# Patient Record
Sex: Female | Born: 1960
Health system: Southern US, Community
[De-identification: ages and names within clinical notes are randomized; demographics above are authoritative.]

## PROBLEM LIST (undated history)

## (undated) DIAGNOSIS — F329 Major depressive disorder, single episode, unspecified: Secondary | ICD-10-CM

## (undated) DIAGNOSIS — M1990 Unspecified osteoarthritis, unspecified site: Secondary | ICD-10-CM

## (undated) DIAGNOSIS — F32A Depression, unspecified: Secondary | ICD-10-CM

## (undated) DIAGNOSIS — R7303 Prediabetes: Secondary | ICD-10-CM

## (undated) DIAGNOSIS — M51369 Other intervertebral disc degeneration, lumbar region without mention of lumbar back pain or lower extremity pain: Secondary | ICD-10-CM

## (undated) DIAGNOSIS — M5136 Other intervertebral disc degeneration, lumbar region: Secondary | ICD-10-CM

## (undated) DIAGNOSIS — I10 Essential (primary) hypertension: Secondary | ICD-10-CM

## (undated) HISTORY — DX: Essential (primary) hypertension: I10

## (undated) HISTORY — DX: Other intervertebral disc degeneration, lumbar region: M51.36

## (undated) HISTORY — DX: Major depressive disorder, single episode, unspecified: F32.9

## (undated) HISTORY — DX: Unspecified osteoarthritis, unspecified site: M19.90

## (undated) HISTORY — DX: Depression, unspecified: F32.A

## (undated) HISTORY — PX: DILATION AND CURETTAGE, DIAGNOSTIC / THERAPEUTIC: SUR384

## (undated) HISTORY — DX: Other intervertebral disc degeneration, lumbar region without mention of lumbar back pain or lower extremity pain: M51.369

---

## 1995-02-28 HISTORY — PX: TUBAL LIGATION: SHX77

## 1998-11-08 ENCOUNTER — Other Ambulatory Visit: Admission: RE | Admit: 1998-11-08 | Discharge: 1998-11-08 | Payer: Self-pay | Admitting: Obstetrics and Gynecology

## 1998-12-24 ENCOUNTER — Ambulatory Visit (HOSPITAL_COMMUNITY): Admission: RE | Admit: 1998-12-24 | Discharge: 1998-12-24 | Payer: Self-pay | Admitting: Obstetrics and Gynecology

## 1998-12-25 ENCOUNTER — Emergency Department (HOSPITAL_COMMUNITY): Admission: EM | Admit: 1998-12-25 | Discharge: 1998-12-25 | Payer: Self-pay | Admitting: Emergency Medicine

## 2000-09-19 ENCOUNTER — Emergency Department (HOSPITAL_COMMUNITY): Admission: EM | Admit: 2000-09-19 | Discharge: 2000-09-19 | Payer: Self-pay | Admitting: Emergency Medicine

## 2001-05-27 ENCOUNTER — Other Ambulatory Visit: Admission: RE | Admit: 2001-05-27 | Discharge: 2001-05-27 | Payer: Self-pay | Admitting: Obstetrics and Gynecology

## 2002-03-15 ENCOUNTER — Inpatient Hospital Stay (HOSPITAL_COMMUNITY): Admission: EM | Admit: 2002-03-15 | Discharge: 2002-03-17 | Payer: Self-pay | Admitting: Emergency Medicine

## 2003-01-26 ENCOUNTER — Other Ambulatory Visit: Admission: RE | Admit: 2003-01-26 | Discharge: 2003-01-26 | Payer: Self-pay | Admitting: Obstetrics and Gynecology

## 2004-06-23 ENCOUNTER — Other Ambulatory Visit: Admission: RE | Admit: 2004-06-23 | Discharge: 2004-06-23 | Payer: Self-pay | Admitting: Obstetrics and Gynecology

## 2005-03-23 ENCOUNTER — Emergency Department (HOSPITAL_COMMUNITY): Admission: EM | Admit: 2005-03-23 | Discharge: 2005-03-23 | Payer: Self-pay | Admitting: Emergency Medicine

## 2011-02-12 ENCOUNTER — Ambulatory Visit: Payer: Self-pay

## 2011-02-12 DIAGNOSIS — N760 Acute vaginitis: Secondary | ICD-10-CM

## 2011-04-24 ENCOUNTER — Other Ambulatory Visit: Payer: Self-pay

## 2011-04-25 ENCOUNTER — Telehealth: Payer: Self-pay

## 2011-04-25 NOTE — Telephone Encounter (Signed)
Pt calling for third time today. States she and pharmacy called last week for refill and pharmacy told her they were waiting to hear from [facility] on status of refill.  [facility] [address]   Pt best: [phone]  bf

## 2011-04-25 NOTE — Telephone Encounter (Signed)
Calling for med refills  Claims it did not work because she lost four pills down the sink and had a lapse in treatment. She has no insurance and would like a second dose of the original prescription.

## 2011-04-26 NOTE — Telephone Encounter (Signed)
LMOM to let pt know that we had faxed pharmacy back on the 25th and 26th that pt would need OV bf we could Rx medication. Left our hours this week

## 2011-05-31 ENCOUNTER — Ambulatory Visit: Payer: Self-pay | Admitting: Physician Assistant

## 2011-05-31 VITALS — BP 144/73 | HR 89 | Temp 98.0°F | Resp 16 | Ht 66.0 in | Wt 187.0 lb

## 2011-05-31 DIAGNOSIS — N898 Other specified noninflammatory disorders of vagina: Secondary | ICD-10-CM

## 2011-05-31 DIAGNOSIS — B009 Herpesviral infection, unspecified: Secondary | ICD-10-CM

## 2011-05-31 DIAGNOSIS — N952 Postmenopausal atrophic vaginitis: Secondary | ICD-10-CM

## 2011-05-31 LAB — POCT WET PREP WITH KOH
Clue Cells Wet Prep HPF POC: NEGATIVE
Trichomonas, UA: NEGATIVE
Yeast Wet Prep HPF POC: NEGATIVE

## 2011-05-31 LAB — POCT URINALYSIS DIPSTICK
Glucose, UA: NEGATIVE
Spec Grav, UA: 1.025
Urobilinogen, UA: 0.2

## 2011-05-31 MED ORDER — VALACYCLOVIR HCL 1 G PO TABS: 500.0000 mg | ORAL_TABLET | Freq: Two times a day (BID) | ORAL | Status: DC

## 2011-05-31 MED ORDER — ESTRADIOL 0.1 MG/GM VA CREA: 2.0000 g | TOPICAL_CREAM | VAGINAL | Status: DC

## 2011-05-31 NOTE — Patient Instructions (Signed)
Atrophic Vaginitis  Atrophic vaginitis is a problem of low levels of estrogen in women. This problem can happen at any age. It is most common in women who have gone through menopause ("the change").    HOW WILL I KNOW IF I HAVE THIS PROBLEM?  You may have:   Trouble with peeing (urinating), such as:   Going to the bathroom often.   A hard time holding your pee until you reach a bathroom.   Leaking pee.   Having pain when you pee.   Itching or a burning feeling.   Vaginal bleeding and spotting.   Pain during sex.   Dryness of the vagina.   A yellow, bad-smelling fluid (discharge) coming from the vagina.  HOW WILL MY DOCTOR CHECK FOR THIS PROBLEM?   During your exam, your doctor will likely find the problem.   If there is a vaginal fluid, it may be checked for infection.  HOW WILL THIS PROBLEM BE TREATED?  Keep the vulvar skin as clean as possible. Moisturizers and lubricants can help with some of the symptoms.  Estrogen replacement can help. There are 2 ways to take estrogen:   Systemic estrogen gets estrogen to your whole body. It takes many weeks or months before the symptoms get better.   You take an estrogen pill.   You use a skin patch. This is a patch that you put on your skin.   If you still have your uterus, your doctor may ask you to take a hormone. Talk to your doctor about the right medicine for you.   Estrogen cream.  This puts estrogen only at the part of your body where you apply it. The cream is put into the vagina or put on the vulvar skin. For some women, estrogen cream works faster than pills or the patch.  CAN ALL WOMEN WITH THIS PROBLEM USE ESTROGEN?  No. Women with certain types of cancer, liver problems, or problems with blood clots should not take estrogen. Your doctor can help you decide the best treatment for your symptoms.  Document Released: 08/02/2007 Document Revised: 02/02/2011 Document Reviewed: 08/02/2007  Exit[employer] Patient Information 2012 [employer].

## 2011-05-31 NOTE — Progress Notes (Signed)
  Subjective:    [name], female    DOB: 1960-11-02, 51 y.o.   MRN: [id number]  HPI  LMP two months ago Irritation and heat in vaginal area Clear discharge Perimenopausal Hot flashes/night sweats for the last year Increased urination "heat" when urinating Menopause One vitamins for symptoms  Concerned about DM, tingling in toes, legs hurting her. Dr. Henderson Cloud is GYN No PCP since husband passed.  Uninsured currently. Mother with DM, s/p bilateral TKA  Review of Systems     Objective:   Physical Exam        Assessment & Plan:

## 2011-05-31 NOTE — Progress Notes (Signed)
  Subjective:    [name], female    DOB: 1960-06-30, 51 y.o.   MRN: [id number]  HPI    Review of Systems     Objective:   Physical Exam        Assessment & Plan:

## 2011-06-05 ENCOUNTER — Telehealth: Payer: Self-pay

## 2011-06-05 NOTE — Telephone Encounter (Signed)
When I reviewed her lab results she does not have bacteria that causes infection just normal vaginal bacteria - I think that her problem is mainly vaginitis 2nd to dryness.  It will take several weeks for the estrogen to start to relieve her symptoms.

## 2011-06-05 NOTE — Telephone Encounter (Signed)
PT STATES SHE HAD A PAP DONE AND THEY SAW BACTERIA IN HER VAGINA. SHE STILL HAVE SOME DISCOMFORT AND WOULD LIKE TO KNOW IF WE WOULD CALL HER SOMETHING IN PLEASE CALL PT AT [phone]   [facility] [address]

## 2011-06-05 NOTE — Telephone Encounter (Signed)
Spoke with pt advised message from Kathryn Parks. Pt understood

## 2011-09-21 ENCOUNTER — Ambulatory Visit: Payer: Self-pay | Admitting: Emergency Medicine

## 2011-09-21 VITALS — BP 131/84 | HR 72 | Temp 98.3°F | Resp 18 | Wt 190.0 lb

## 2011-09-21 DIAGNOSIS — N952 Postmenopausal atrophic vaginitis: Secondary | ICD-10-CM

## 2011-09-21 DIAGNOSIS — N9489 Other specified conditions associated with female genital organs and menstrual cycle: Secondary | ICD-10-CM

## 2011-09-21 DIAGNOSIS — N898 Other specified noninflammatory disorders of vagina: Secondary | ICD-10-CM

## 2011-09-21 DIAGNOSIS — R300 Dysuria: Secondary | ICD-10-CM

## 2011-09-21 LAB — POCT URINALYSIS DIPSTICK
Blood, UA: NEGATIVE
Leukocytes, UA: NEGATIVE
Nitrite, UA: NEGATIVE
Protein, UA: NEGATIVE
Urobilinogen, UA: 0.2
pH, UA: 6

## 2011-09-21 LAB — POCT UA - MICROSCOPIC ONLY
Casts, Ur, LPF, POC: NEGATIVE
Crystals, Ur, HPF, POC: NEGATIVE
Mucus, UA: NEGATIVE
Yeast, UA: NEGATIVE

## 2011-09-21 MED ORDER — FLUCONAZOLE 150 MG PO TABS: 150.0000 mg | ORAL_TABLET | Freq: Once | ORAL | Status: AC

## 2011-09-21 MED ORDER — METRONIDAZOLE 500 MG PO TABS: 500.0000 mg | ORAL_TABLET | Freq: Two times a day (BID) | ORAL | Status: AC

## 2011-09-21 MED ORDER — ESTRADIOL 0.1 MG/GM VA CREA: 2.0000 g | TOPICAL_CREAM | VAGINAL | Status: DC

## 2011-09-21 NOTE — Progress Notes (Signed)
[facility] [address] [place] [address] [phone]  Date:  09/21/2011   Name:  Kathryn Parks   DOB:  [birth year]   MRN:  [id number]  PCP:  No primary provider on file.    Chief Complaint: Back Pain and vaginal dryness   History of Present Illness:  Kathryn Parks is a 51 y.o. very pleasant female patient who presents with the following:  She continues to have trouble with vaginal discomfort.  She is worried that she may have a UTI.  She does have HSV- she has had this since about 45.   She is going through menopause.  Her last period was 08/29/11.  Her bleeding has been irregular.  Kathryn Parks has been going through menopause for a couple of years, but her symptoms have been present for about 9 months.   She notes vaginal dryness and itching, and urine stings the skin.  Not really having discharge.   She has been taking some azo for possible UTI.  She has noted these symptoms on and off for several months.  She has had STI testing and this was negative.    She never actually filled the estrogen cream that she was given at her last visit due to expense.    Kathryn Parks also notes some lower back pain.  It can occur on and off, and gets better with a BC powder.  She has had this problem for about 6 months.    S/p BTL  There is no problem list on file for this patient.   No past medical history on file.  No past surgical history on file.  History  Substance Use Topics  . Smoking status: Never Smoker   . Smokeless tobacco: Not on file  . Alcohol Use: Not on file    No family history on file.  No Known Allergies  Medication list has been reviewed and updated.  Current Outpatient Prescriptions on File Prior to Visit  Medication Sig Dispense Refill  . estradiol (ESTRACE VAGINAL) 0.1 MG/GM vaginal cream Place 0.25 Applicatorfuls vaginally 2 (two) times a week.  42.5 g  12    Review of Systems:  As per HPI- otherwise negative.   Physical  Examination: Filed Vitals:   09/21/11 1651  BP: 131/84  Pulse: 72  Temp: 98.3 F (36.8 C)  Resp: 18   Filed Vitals:   09/21/11 1651  Weight: 190 lb (86.183 kg)   There is no height on file to calculate BMI. Ideal Body Weight:    GEN: WDWN, NAD, Non-toxic, A & O x 3 HEENT: Atraumatic, Normocephalic. Neck supple. No masses, No LAD. Ears and Nose: No external deformity. CV: RRR, No M/G/R. No JVD. No thrill. No extra heart sounds. PULM: CTA B, no wheezes, crackles, rhonchi. No retractions. No resp. distress. No accessory muscle use. ABD: S, NT, ND, +BS. No rebound. No HSM. EXTR: No c/c/e NEURO Normal gait.  PSYCH: Normally interactive. Conversant. Not depressed or anxious appearing.  Calm demeanor.  GU: external genitals are dry and atrophic  Results for orders placed in visit on 09/21/11  POCT UA - MICROSCOPIC ONLY      Component Value Range   WBC, Ur, HPF, POC 1-2     RBC, urine, microscopic 1-3     Bacteria, U Microscopic tr     Mucus, UA neg     Epithelial cells, urine per micros 1-3     Crystals, Ur, HPF, POC neg  Casts, Ur, LPF, POC neg     Yeast, UA neg    POCT URINALYSIS DIPSTICK      Component Value Range   Color, UA yellow     Clarity, UA clear     Glucose, UA neg     Bilirubin, UA neg     Ketones, UA neg     Spec Grav, UA 1.020     Blood, UA neg     pH, UA 6.0     Protein, UA neg     Urobilinogen, UA 0.2     Nitrite, UA neg     Leukocytes, UA Negative     Wet prep: negative  Assessment and Plan: 1. Dysuria  POCT UA - Microscopic Only, POCT urinalysis dipstick  2. Vaginal dryness  metroNIDAZOLE (FLAGYL) 500 MG tablet, fluconazole (DIFLUCAN) 150 MG tablet  3. Atrophic vaginitis  estradiol (ESTRACE VAGINAL) 0.1 MG/GM vaginal cream   Suspect that Kathryn Parks's symptoms are due to vaginal dryness. She did not understand that the estrace cream was meant to treat this condition- she will try and fit it into her budget and will look for a coupon.  Will try  estrace vaginal cream twice a week, and also treat for any infection with flagyl and diflucan/  She will let me know if not better in the next few weeks.    Kathryn Parks Chuong, MD   Although this note is appearing under Dr. Marice Potter (for unknown reasons) he did not see this patient- she was seen by myself, Dr. Shanda Bumps Kathryn Parks

## 2011-09-25 ENCOUNTER — Telehealth: Payer: Self-pay

## 2011-09-25 NOTE — Telephone Encounter (Signed)
PT STATES DR COPLAND TOLD HER TO CALL AND SEE IF WE HAVE SAMPLES OF ESTROGEN. PLEASE CALL [phone]

## 2011-09-26 NOTE — Telephone Encounter (Signed)
Advised we do not have any samples

## 2012-01-16 ENCOUNTER — Encounter (HOSPITAL_COMMUNITY): Payer: Self-pay | Admitting: Emergency Medicine

## 2012-01-16 ENCOUNTER — Emergency Department (HOSPITAL_COMMUNITY)
Admission: EM | Admit: 2012-01-16 | Discharge: 2012-01-16 | Disposition: A | Discharge status: Home / Self Care | Payer: Self-pay | Attending: Emergency Medicine | Admitting: Emergency Medicine

## 2012-01-16 DIAGNOSIS — X58XXXA Exposure to other specified factors, initial encounter: Secondary | ICD-10-CM | POA: Insufficient documentation

## 2012-01-16 DIAGNOSIS — Y939 Activity, unspecified: Secondary | ICD-10-CM | POA: Insufficient documentation

## 2012-01-16 DIAGNOSIS — S39012A Strain of muscle, fascia and tendon of lower back, initial encounter: Secondary | ICD-10-CM

## 2012-01-16 DIAGNOSIS — S335XXA Sprain of ligaments of lumbar spine, initial encounter: Secondary | ICD-10-CM | POA: Insufficient documentation

## 2012-01-16 DIAGNOSIS — Y929 Unspecified place or not applicable: Secondary | ICD-10-CM | POA: Insufficient documentation

## 2012-01-16 LAB — WET PREP, GENITAL
Clue Cells Wet Prep HPF POC: NONE SEEN
Trich, Wet Prep: NONE SEEN
Yeast Wet Prep HPF POC: NONE SEEN

## 2012-01-16 LAB — URINALYSIS, ROUTINE W REFLEX MICROSCOPIC
Bilirubin Urine: NEGATIVE
Glucose, UA: NEGATIVE mg/dL
Ketones, ur: NEGATIVE mg/dL
pH: 6.5 (ref 5.0–8.0)

## 2012-01-16 LAB — URINE MICROSCOPIC-ADD ON

## 2012-01-16 MED ORDER — OXYCODONE-ACETAMINOPHEN 5-325 MG PO TABS
1.0000 | ORAL_TABLET | Freq: Once | ORAL | Status: AC
  Administered 2012-01-16: 1 via ORAL
  Filled 2012-01-16: qty 1

## 2012-01-16 MED ORDER — CYCLOBENZAPRINE HCL 5 MG PO TABS: 5.0000 mg | ORAL_TABLET | Freq: Three times a day (TID) | ORAL | Status: DC | PRN

## 2012-01-16 MED ORDER — NAPROXEN 500 MG PO TABS: 500.0000 mg | ORAL_TABLET | Freq: Two times a day (BID) | ORAL | Status: DC

## 2012-01-16 NOTE — ED Notes (Signed)
Patient given discharge instructions, information, prescriptions, and diet order. Patient states that they adequately understand discharge information given and to return to ED if symptoms return or worsen.     

## 2012-01-16 NOTE — ED Notes (Signed)
Pt states that she has had intermittent low back pain and vaginal dryness for 6 months.

## 2012-01-16 NOTE — ED Provider Notes (Signed)
History     CSN: [id number]  Arrival date & time 01/16/12  1729   First MD Initiated Contact with Patient 01/16/12 1926      Chief Complaint  Patient presents with  . Back Pain  . Vaginal Dryness    HPI Pt started having low back pain starting yesterday.  The pain is in the lower back.  It does not radiate.  It increases with movement.  Some burning with urination.  Pt has had some trouble with vaginal itching and dryness for 6 months.  Associated with this she has had some lower abdominal discomfort.  Pt thinks it could be menopause.  She has not seen a doctor for this before tonight.  When she developed the back pain today she became concerned.  She has noticed some yellow discharge as well. No past medical history on file.  No past surgical history on file.  No family history on file.  History  Substance Use Topics  . Smoking status: Never Smoker   . Smokeless tobacco: Not on file  . Alcohol Use: 0.6 oz/week    1 Glasses of wine per week    OB History    Grav Para Term Preterm Abortions TAB SAB Ect Mult Living                  Review of Systems  Constitutional: Negative for fever.  HENT: Negative for neck pain and voice change.   Respiratory: Negative for shortness of breath.   Cardiovascular: Negative for palpitations.  Gastrointestinal: Negative for abdominal pain.  Musculoskeletal: Negative for joint swelling.  Skin: Negative for color change.  Neurological: Negative for numbness (no paresthesias).       No muscle weakness  Psychiatric/Behavioral: Negative for confusion.  All other systems reviewed and are negative.    Allergies  Review of patient's allergies indicates no known allergies.  Home Medications  No current outpatient prescriptions on file.  BP 160/115  Pulse 79  Temp 98.1 F (36.7 C) (Oral)  Resp 12  SpO2 100%  LMP 12/26/2011  Physical Exam  Nursing note and vitals reviewed. Constitutional: She appears well-developed and  well-nourished. No distress.  HENT:  Head: Normocephalic and atraumatic.  Right Ear: External ear normal.  Left Ear: External ear normal.  Eyes: Conjunctivae normal are normal. Right eye exhibits no discharge. Left eye exhibits no discharge. No scleral icterus.  Neck: Neck supple. No tracheal deviation present.  Cardiovascular: Normal rate, regular rhythm and intact distal pulses.   Pulmonary/Chest: Effort normal and breath sounds normal. No stridor. No respiratory distress. She has no wheezes. She has no rales.  Abdominal: Soft. Bowel sounds are normal. She exhibits no distension. There is no tenderness. There is no rebound and no guarding. Hernia confirmed negative in the right inguinal area and confirmed negative in the left inguinal area.  Genitourinary: Vagina normal. Guaiac negative stool. There is no rash on the right labia. There is no rash on the left labia. Uterus is enlarged. Uterus is not deviated and not tender. Cervix exhibits no motion tenderness, no discharge and no friability. Right adnexum displays no mass. Left adnexum displays no mass. No vaginal discharge found.       Possible fibroids   Musculoskeletal: She exhibits tenderness. She exhibits no edema.       Lumbar back: She exhibits tenderness and pain. She exhibits no swelling, no edema and no deformity.  Neurological: She is alert. She has normal strength. No sensory deficit. Cranial nerve deficit:  no gross defecits noted. She exhibits normal muscle tone. She displays no seizure activity. Coordination normal.  Skin: Skin is warm and dry. No rash noted.  Psychiatric: She has a normal mood and affect.    ED Course  Procedures (including critical care time)  Labs Reviewed  URINALYSIS, ROUTINE W REFLEX MICROSCOPIC - Abnormal; Notable for the following:    Hgb urine dipstick TRACE (*)     All other components within normal limits  WET PREP, GENITAL - Abnormal; Notable for the following:    WBC, Wet Prep HPF POC FEW (*)      All other components within normal limits  URINE MICROSCOPIC-ADD ON  GC/CHLAMYDIA PROBE AMP  RPR   No results found.   1. Lumbar strain       MDM  Back pain is likely musculo skeletal.  Doubt urinary or pelvic etiology.  Recc follow up with a gynecologist to address the vaginal dryness possible menopausal symptoms.        Celene Kras, MD 01/16/12 [phone]

## 2012-01-16 NOTE — ED Notes (Addendum)
Pt from home, alert and oriented, with c/o lower back pain, vaginal dryness, itching and discharge at times- yellow tint. Sts she has had this problem x 1 year and it has gotten worse recently, also that the vaginal discharge that she experiences sometimes has changed colors to yellow. Pt sts she has been using aloe vera gel to lubricate her vagina.

## 2012-01-17 LAB — RPR: RPR Ser Ql: NONREACTIVE

## 2012-04-17 ENCOUNTER — Telehealth: Payer: Self-pay

## 2012-04-17 MED ORDER — ESTRADIOL 0.1 MG/GM VA CREA: 2.0000 g | TOPICAL_CREAM | Freq: Every day | VAGINAL | Status: DC

## 2012-04-17 MED ORDER — VALACYCLOVIR HCL 500 MG PO TABS: 500.0000 mg | ORAL_TABLET | Freq: Two times a day (BID) | ORAL | Status: DC

## 2012-04-17 NOTE — Telephone Encounter (Signed)
Pt would like to know if someone could call her a rx in for estrogen and valtrex. Pt states that she was seen in our office in sept or oct and was precribed those 2 rx's but at that time she did not have insurance and was unable to purchase them because of the price. Pt now has insurance and would greatly appreciate if the estrogen and valtrex could be called in again. Please advise.  Pharmacy:[facility] on [address]  [phone]

## 2012-04-17 NOTE — Addendum Note (Signed)
Addended by: Godfrey Pick on: 04/17/2012 03:55 PM   Modules accepted: Orders

## 2012-04-17 NOTE — Telephone Encounter (Signed)
Called patient to advise  °

## 2012-04-17 NOTE — Telephone Encounter (Signed)
Sent to pharmacy.  Pt needs OV before this runs out as she has not been seen since July 2013

## 2012-04-17 NOTE — Telephone Encounter (Signed)
She was here in July, and Estrace cream was sent. Can we resend this? The last Valtrex (herpes type 2) was from April 2013, was written for 500 mg to take 1/2 bid, please advise if we can resend this one also. Gen Clagg

## 2012-05-08 ENCOUNTER — Other Ambulatory Visit: Payer: Self-pay | Admitting: Obstetrics

## 2012-05-08 ENCOUNTER — Encounter: Payer: Self-pay | Admitting: Internal Medicine

## 2012-05-08 DIAGNOSIS — Z1231 Encounter for screening mammogram for malignant neoplasm of breast: Secondary | ICD-10-CM

## 2012-05-08 DIAGNOSIS — E28319 Asymptomatic premature menopause: Secondary | ICD-10-CM

## 2012-05-14 ENCOUNTER — Ambulatory Visit (HOSPITAL_COMMUNITY): Payer: BC Managed Care – PPO

## 2012-05-14 ENCOUNTER — Ambulatory Visit (HOSPITAL_COMMUNITY): Admission: RE | Admit: 2012-05-14 | Payer: BC Managed Care – PPO | Source: Ambulatory Visit

## 2012-05-20 ENCOUNTER — Encounter: Payer: Self-pay | Admitting: Internal Medicine

## 2012-05-27 ENCOUNTER — Ambulatory Visit (HOSPITAL_COMMUNITY)
Admission: RE | Admit: 2012-05-27 | Discharge: 2012-05-27 | Disposition: A | Discharge status: Home / Self Care | Payer: BC Managed Care – PPO | Source: Ambulatory Visit | Attending: Obstetrics | Admitting: Obstetrics

## 2012-05-27 DIAGNOSIS — Z1231 Encounter for screening mammogram for malignant neoplasm of breast: Secondary | ICD-10-CM | POA: Insufficient documentation

## 2012-05-27 LAB — HM MAMMOGRAPHY

## 2012-05-30 ENCOUNTER — Ambulatory Visit (INDEPENDENT_AMBULATORY_CARE_PROVIDER_SITE_OTHER): Payer: BC Managed Care – PPO | Admitting: Internal Medicine

## 2012-05-30 ENCOUNTER — Encounter: Payer: Self-pay | Admitting: Internal Medicine

## 2012-05-30 ENCOUNTER — Ambulatory Visit (INDEPENDENT_AMBULATORY_CARE_PROVIDER_SITE_OTHER)
Admission: RE | Admit: 2012-05-30 | Discharge: 2012-05-30 | Disposition: A | Discharge status: Home / Self Care | Payer: BC Managed Care – PPO | Source: Ambulatory Visit | Attending: Internal Medicine | Admitting: Internal Medicine

## 2012-05-30 VITALS — BP 130/80 | HR 71 | Temp 98.0°F | Resp 16 | Ht 65.0 in | Wt 193.0 lb

## 2012-05-30 DIAGNOSIS — M545 Low back pain, unspecified: Secondary | ICD-10-CM

## 2012-05-30 DIAGNOSIS — M25521 Pain in right elbow: Secondary | ICD-10-CM

## 2012-05-30 DIAGNOSIS — M79605 Pain in left leg: Secondary | ICD-10-CM | POA: Insufficient documentation

## 2012-05-30 DIAGNOSIS — M542 Cervicalgia: Secondary | ICD-10-CM

## 2012-05-30 DIAGNOSIS — M25529 Pain in unspecified elbow: Secondary | ICD-10-CM

## 2012-05-30 DIAGNOSIS — G8929 Other chronic pain: Secondary | ICD-10-CM

## 2012-05-30 DIAGNOSIS — M4802 Spinal stenosis, cervical region: Secondary | ICD-10-CM | POA: Insufficient documentation

## 2012-05-30 DIAGNOSIS — Z23 Encounter for immunization: Secondary | ICD-10-CM

## 2012-05-30 DIAGNOSIS — I10 Essential (primary) hypertension: Secondary | ICD-10-CM | POA: Insufficient documentation

## 2012-05-30 NOTE — Assessment & Plan Note (Signed)
I will check a plain film today

## 2012-05-30 NOTE — Assessment & Plan Note (Signed)
I will check her plain films today She will continue taking the current meds

## 2012-05-30 NOTE — Assessment & Plan Note (Signed)
I will check her plain films today

## 2012-05-30 NOTE — Progress Notes (Signed)
Subjective:    [name], female    DOB: 06-24-1960, 52 y.o.   MRN: [id number]  Arthritis Presents for initial visit. The disease course has been worsening. The condition has lasted for 2 years. She complains of pain. She reports no stiffness, joint swelling or joint warmth. Affected locations include the neck and right elbow. Her pain is at a severity of 2/10. Associated symptoms include pain at night and pain while resting. Pertinent negatives include no diarrhea, dry eyes, dry mouth, dysuria, fatigue, fever, rash, Raynaud's syndrome, uveitis or weight loss. Her past medical history is significant for chronic back pain and osteoarthritis. There is no history of lupus or psoriasis.  Her pertinent risk factors include overuse. Risk factors do not include scoliosis. Past treatments include NSAIDs, an opioid and acetaminophen. The treatment provided significant relief. Factors aggravating her arthritis include activity. Compliance with prior treatments has been good.      Review of Systems  Constitutional: Negative.  Negative for fever, chills, weight loss, diaphoresis, activity change, appetite change, fatigue and unexpected weight change.  HENT: Negative.   Eyes: Negative.   Respiratory: Negative.  Negative for cough, choking, chest tightness, shortness of breath, wheezing and stridor.   Cardiovascular: Negative.  Negative for chest pain, palpitations and leg swelling.  Gastrointestinal: Negative.  Negative for nausea, abdominal pain and diarrhea.  Endocrine: Negative.   Genitourinary: Negative.  Negative for dysuria.  Musculoskeletal: Positive for back pain (chronic, unchanged), arthralgias and arthritis. Negative for myalgias, joint swelling, gait problem and stiffness.  Skin: Negative for color change, pallor, rash and wound.  Allergic/Immunologic: Negative.   Neurological: Negative.  Negative for dizziness, weakness and light-headedness.  Hematological: Negative.   Negative for adenopathy. Does not bruise/bleed easily.  Psychiatric/Behavioral: Negative.        Objective:   Physical Exam  Vitals reviewed. Constitutional: She is oriented to person, place, and time. She appears well-developed and well-nourished. No distress.  HENT:  Head: Normocephalic and atraumatic.  Mouth/Throat: Oropharynx is clear and moist. No oropharyngeal exudate.  Eyes: Conjunctivae are normal. Right eye exhibits no discharge. Left eye exhibits no discharge. No scleral icterus.  Neck: Normal range of motion. Neck supple. No JVD present. No tracheal deviation present. No thyromegaly present.  Cardiovascular: Normal rate, regular rhythm, normal heart sounds and intact distal pulses.  Exam reveals no gallop and no friction rub.   No murmur heard. Pulmonary/Chest: Effort normal and breath sounds normal. No stridor. No respiratory distress. She has no wheezes. She has no rales. She exhibits no tenderness.  Abdominal: Soft. Bowel sounds are normal. She exhibits no distension and no mass. There is no tenderness. There is no rebound and no guarding.  Musculoskeletal: Normal range of motion. She exhibits no edema and no tenderness.       Right elbow: She exhibits normal range of motion, no swelling, no effusion, no deformity and no laceration. Tenderness found. Olecranon process tenderness noted.       Cervical back: Normal. She exhibits normal range of motion, no tenderness, no bony tenderness, no swelling, no edema, no deformity, no laceration, no pain, no spasm and normal pulse.       Lumbar back: Normal. She exhibits normal range of motion, no tenderness, no bony tenderness, no swelling, no edema, no deformity, no laceration, no pain, no spasm and normal pulse.  Lymphadenopathy:    She has no cervical adenopathy.  Neurological: She is oriented to person, place, and time.  Skin: Skin is warm and  dry. No rash noted. She is not diaphoretic. No erythema. No pallor.  Psychiatric: She has  a normal mood and affect. Her behavior is normal. Judgment and thought content normal.     No results found for this basename: WBC, HGB, HCT, PLT, GLUCOSE, CHOL, TRIG, HDL, LDLDIRECT, LDLCALC, ALT, AST, NA, K, CL, CREATININE, BUN, CO2, TSH, PSA, INR, GLUF, HGBA1C, MICROALBUR       Assessment & Plan:

## 2012-05-30 NOTE — Assessment & Plan Note (Addendum)
Her BP is well controlled Today I will check her labs today to see if she has any secondary causes or end organ damage

## 2012-05-30 NOTE — Patient Instructions (Signed)
Back Pain, Adult Low back pain is very common. About 1 in 5 people have back pain.The cause of low back pain is rarely dangerous. The pain often gets better over time.About half of people with a sudden onset of back pain feel better in just 2 weeks. About 8 in 10 people feel better by 6 weeks.  CAUSES Some common causes of back pain include:  Strain of the muscles or ligaments supporting the spine.  Wear and tear (degeneration) of the spinal discs.  Arthritis.  Direct injury to the back. DIAGNOSIS Most of the time, the direct cause of low back pain is not known.However, back pain can be treated effectively even when the exact cause of the pain is unknown.Answering your caregiver's questions about your overall health and symptoms is one of the most accurate ways to make sure the cause of your pain is not dangerous. If your caregiver needs more information, he or she may order lab work or imaging tests (X-rays or MRIs).However, even if imaging tests show changes in your back, this usually does not require surgery. HOME CARE INSTRUCTIONS For many people, back pain returns.Since low back pain is rarely dangerous, it is often a condition that people can learn to manageon their own.   Remain active. It is stressful on the back to sit or stand in one place. Do not sit, drive, or stand in one place for more than 30 minutes at a time. Take short walks on level surfaces as soon as pain allows.Try to increase the length of time you walk each day.  Do not stay in bed.Resting more than 1 or 2 days can delay your recovery.  Do not avoid exercise or work.Your body is made to move.It is not dangerous to be active, even though your back may hurt.Your back will likely heal faster if you return to being active before your pain is gone.  Pay attention to your body when you bend and lift. Many people have less discomfortwhen lifting if they bend their knees, keep the load close to their bodies,and  avoid twisting. Often, the most comfortable positions are those that put less stress on your recovering back.  Find a comfortable position to sleep. Use a firm mattress and lie on your side with your knees slightly bent. If you lie on your back, put a pillow under your knees.  Only take over-the-counter or prescription medicines as directed by your caregiver. Over-the-counter medicines to reduce pain and inflammation are often the most helpful.Your caregiver may prescribe muscle relaxant drugs.These medicines help dull your pain so you can more quickly return to your normal activities and healthy exercise.  Put ice on the injured area.  Put ice in a plastic bag.  Place a towel between your skin and the bag.  Leave the ice on for 15 to 20 minutes, 3 to 4 times a day for the first 2 to 3 days. After that, ice and heat may be alternated to reduce pain and spasms.  Ask your caregiver about trying back exercises and gentle massage. This may be of some benefit.  Avoid feeling anxious or stressed.Stress increases muscle tension and can worsen back pain.It is important to recognize when you are anxious or stressed and learn ways to manage it.Exercise is a great option. SEEK MEDICAL CARE IF:  You have pain that is not relieved with rest or medicine.  You have pain that does not improve in 1 week.  You have new symptoms.  You are generally   not feeling well. SEEK IMMEDIATE MEDICAL CARE IF:   You have pain that radiates from your back into your legs.  You develop new bowel or bladder control problems.  You have unusual weakness or numbness in your arms or legs.  You develop nausea or vomiting.  You develop abdominal pain.  You feel faint. Document Released: 02/13/2005 Document Revised: 08/15/2011 Document Reviewed: 07/04/2010 Exit[employer] Patient Information 2013 [employer].  

## 2012-05-31 ENCOUNTER — Telehealth: Payer: Self-pay

## 2012-05-31 NOTE — Telephone Encounter (Signed)
I'm sorry, I meant to ask about xray. Pt advised of results and states that she will come in Monday to sign contract and pick up Rx

## 2012-05-31 NOTE — Telephone Encounter (Signed)
She did not have an MRI done she had plain xrays and results letter was sent She will have to return to sign a controlled substance contract for me to prescribe percocet

## 2012-05-31 NOTE — Telephone Encounter (Signed)
Pt called requesting results of recent MRI as well as a refill of Oxycodone for related back pain, please advise.

## 2012-06-03 ENCOUNTER — Other Ambulatory Visit: Payer: Self-pay | Admitting: Internal Medicine

## 2012-06-03 ENCOUNTER — Telehealth: Payer: Self-pay

## 2012-06-03 DIAGNOSIS — M542 Cervicalgia: Secondary | ICD-10-CM

## 2012-06-03 MED ORDER — OXYCODONE-ACETAMINOPHEN 5-325 MG PO TABS: 1.0000 | ORAL_TABLET | Freq: Two times a day (BID) | ORAL | Status: DC

## 2012-06-03 NOTE — Telephone Encounter (Signed)
Pt notified to pick up and rx placed upfront 

## 2012-06-03 NOTE — Telephone Encounter (Signed)
Pt stopped by, completed pain contract. Please advise if  Oxy rx should be for 1 or three mos. Thanks

## 2012-06-03 NOTE — Telephone Encounter (Signed)
3

## 2012-06-04 ENCOUNTER — Ambulatory Visit: Payer: Self-pay | Admitting: Obstetrics

## 2012-06-12 ENCOUNTER — Telehealth: Payer: Self-pay | Admitting: Internal Medicine

## 2012-06-12 NOTE — Telephone Encounter (Signed)
Caller: Kathryn Parks/Patient; Phone: [phone]; Reason for Call: She is inquiring on how to get a Handicap sticker for her car.  She states she has problems with her neck and Back.  Arthritis and DDD.  Please contact her with information to assist in this process.  Please contact [phone].

## 2012-06-12 NOTE — Telephone Encounter (Signed)
Caller: [name]/Patient; Phone: [phone]; Reason for Call: Patient received a call from the office and missed the call.  She is calling back.  It is in regards to obtaining a Handicapped sticker for her car.  Reviewed EPIC and noted that Dr. Yetta Barre states that the forms are available at the office.  What does the patient need to do?   Does physician fill it out and mail to her? Does she need appt.   ?  PLEASE CONTACT [phone]

## 2012-06-12 NOTE — Telephone Encounter (Signed)
We have the forms here

## 2012-06-13 ENCOUNTER — Encounter: Payer: BC Managed Care – PPO | Admitting: Internal Medicine

## 2012-06-19 ENCOUNTER — Ambulatory Visit: Payer: Self-pay | Admitting: Obstetrics

## 2012-06-19 NOTE — Telephone Encounter (Signed)
Pt returning call again about handicap form-pt wants to know if form is ready for her to pickup at office.

## 2012-06-19 NOTE — Telephone Encounter (Signed)
LMOVM to pick up//placed upfront in cabinet

## 2012-06-19 NOTE — Telephone Encounter (Signed)
done

## 2012-07-01 ENCOUNTER — Encounter: Payer: Self-pay | Admitting: Internal Medicine

## 2012-07-01 ENCOUNTER — Ambulatory Visit (AMBULATORY_SURGERY_CENTER): Payer: BC Managed Care – PPO | Admitting: *Deleted

## 2012-07-01 VITALS — Ht 65.0 in | Wt 189.6 lb

## 2012-07-01 DIAGNOSIS — Z1211 Encounter for screening for malignant neoplasm of colon: Secondary | ICD-10-CM

## 2012-07-01 MED ORDER — MOVIPREP 100 G PO SOLR: ORAL | Status: DC

## 2012-07-16 ENCOUNTER — Encounter: Payer: Self-pay | Admitting: Internal Medicine

## 2012-07-16 ENCOUNTER — Ambulatory Visit (AMBULATORY_SURGERY_CENTER): Payer: BC Managed Care – PPO | Admitting: Internal Medicine

## 2012-07-16 VITALS — BP 138/83 | HR 64 | Temp 97.6°F | Resp 15 | Ht 65.0 in | Wt 189.0 lb

## 2012-07-16 DIAGNOSIS — D126 Benign neoplasm of colon, unspecified: Secondary | ICD-10-CM

## 2012-07-16 DIAGNOSIS — Z1211 Encounter for screening for malignant neoplasm of colon: Secondary | ICD-10-CM

## 2012-07-16 MED ORDER — SODIUM CHLORIDE 0.9 % IV SOLN: 500.0000 mL | INTRAVENOUS | Status: DC

## 2012-07-16 NOTE — Progress Notes (Signed)
Patient did not experience any of the following events: a burn prior to discharge; a fall within the facility; wrong site/side/patient/procedure/implant event; or a hospital transfer or hospital admission upon discharge from the facility. (G8907) Patient did not have preoperative order for IV antibiotic SSI prophylaxis. (G8918)  

## 2012-07-16 NOTE — Patient Instructions (Addendum)

## 2012-07-16 NOTE — Progress Notes (Signed)
Called to room to assist during endoscopic procedure.  Patient ID and intended procedure confirmed with present staff. Received instructions for my participation in the procedure from the performing physician.  

## 2012-07-16 NOTE — Op Note (Signed)
 [facility] [address] [place], [address]   COLONOSCOPY PROCEDURE REPORT  PATIENT: Kathryn Parks, Kathryn Parks  MR#: [id number] BIRTHDATE: January 17, 1961 , 52  yrs. old GENDER: Female ENDOSCOPIST: Hart Carwin, MD REFERRED BY:  Coral Ceo, M.D. , Dr Sanda Linger PROCEDURE DATE:  07/16/2012 PROCEDURE:   Colonoscopy with biopsy ASA CLASS:   Class I INDICATIONS:Average risk patient for colon cancer. MEDICATIONS: MAC sedation, administered by CRNA and propofol (Diprivan) 250mg  IV  DESCRIPTION OF PROCEDURE:   After the risks and benefits and of the procedure were explained, informed consent was obtained.  A digital rectal exam revealed no abnormalities of the rectum.    The LB [id number]  endoscope was introduced through the anus and advanced to the cecum, which was identified by both the appendix and ileocecal valve .  The quality of the prep was good, using MoviPrep .  The instrument was then slowly withdrawn as the colon was fully examined.     COLON FINDINGS: A small smooth flat polyp was found at the ileocecal valve.  A polypectomy was performed with cold forceps.  The resection was complete and the polyp tissue was completely retrieved.     Retroflexed views revealed no abnormalities.     The scope was then withdrawn from the patient and the procedure completed.  COMPLICATIONS: There were no complications. ENDOSCOPIC IMPRESSION: Small flat polyp was found at the ileocecal valve; polypectomy was performed with cold forceps , it is not clear whether it was actually a polyp or a fold on the ileo cecal valve  RECOMMENDATIONS: Await pathology results   REPEAT EXAM: In 10 year(s)  for Colonoscopy.  cc:  _______________________________ eSignedHart Carwin, MD 07/16/2012 8:50 AM

## 2012-07-17 ENCOUNTER — Telehealth: Payer: Self-pay | Admitting: *Deleted

## 2012-07-17 NOTE — Telephone Encounter (Signed)
  Follow up Call-  Call back number 07/16/2012  Post procedure Call Back phone  # [phone]  Permission to leave phone message Yes     Patient questions:  Do you have a fever, pain , or abdominal swelling? no Pain Score  0 *  Have you tolerated food without any problems? yes  Have you been able to return to your normal activities? yes  Do you have any questions about your discharge instructions: Diet   no Medications  no Follow up visit  no  Do you have questions or concerns about your Care? no  Actions: * If pain score is 4 or above: No action needed, pain <4.

## 2012-07-24 ENCOUNTER — Encounter: Payer: Self-pay | Admitting: Internal Medicine

## 2012-07-24 ENCOUNTER — Other Ambulatory Visit: Payer: Self-pay | Admitting: Internal Medicine

## 2012-07-31 ENCOUNTER — Encounter: Payer: Self-pay | Admitting: Internal Medicine

## 2012-07-31 ENCOUNTER — Ambulatory Visit (INDEPENDENT_AMBULATORY_CARE_PROVIDER_SITE_OTHER): Payer: BC Managed Care – PPO | Admitting: Internal Medicine

## 2012-07-31 VITALS — BP 120/88 | HR 72 | Temp 97.2°F | Resp 16 | Wt 193.0 lb

## 2012-07-31 DIAGNOSIS — M79605 Pain in left leg: Secondary | ICD-10-CM

## 2012-07-31 DIAGNOSIS — M542 Cervicalgia: Secondary | ICD-10-CM

## 2012-07-31 DIAGNOSIS — M545 Low back pain, unspecified: Secondary | ICD-10-CM

## 2012-07-31 NOTE — Patient Instructions (Signed)
Back Pain, Adult  Low back pain is very common. About 1 in 5 people have back pain. The cause of low back pain is rarely dangerous. The pain often gets better over time. About half of people with a sudden onset of back pain feel better in just 2 weeks. About 8 in 10 people feel better by 6 weeks.   CAUSES  Some common causes of back pain include:  · Strain of the muscles or ligaments supporting the spine.  · Wear and tear (degeneration) of the spinal discs.  · Arthritis.  · Direct injury to the back.  DIAGNOSIS  Most of the time, the direct cause of low back pain is not known. However, back pain can be treated effectively even when the exact cause of the pain is unknown. Answering your caregiver's questions about your overall health and symptoms is one of the most accurate ways to make sure the cause of your pain is not dangerous. If your caregiver needs more information, he or she may order lab work or imaging tests (X-rays or MRIs). However, even if imaging tests show changes in your back, this usually does not require surgery.  HOME CARE INSTRUCTIONS  For many people, back pain returns. Since low back pain is rarely dangerous, it is often a condition that people can learn to manage on their own.   · Remain active. It is stressful on the back to sit or stand in one place. Do not sit, drive, or stand in one place for more than 30 minutes at a time. Take short walks on level surfaces as soon as pain allows. Try to increase the length of time you walk each day.  · Do not stay in bed. Resting more than 1 or 2 days can delay your recovery.  · Do not avoid exercise or work. Your body is made to move. It is not dangerous to be active, even though your back may hurt. Your back will likely heal faster if you return to being active before your pain is gone.  · Pay attention to your body when you  bend and lift. Many people have less discomfort when lifting if they bend their knees, keep the load close to their bodies, and  avoid twisting. Often, the most comfortable positions are those that put less stress on your recovering back.  · Find a comfortable position to sleep. Use a firm mattress and lie on your side with your knees slightly bent. If you lie on your back, put a pillow under your knees.  · Only take over-the-counter or prescription medicines as directed by your caregiver. Over-the-counter medicines to reduce pain and inflammation are often the most helpful. Your caregiver may prescribe muscle relaxant drugs. These medicines help dull your pain so you can more quickly return to your normal activities and healthy exercise.  · Put ice on the injured area.  · Put ice in a plastic bag.  · Place a towel between your skin and the bag.  · Leave the ice on for 15-20 minutes, 3-4 times a day for the first 2 to 3 days. After that, ice and heat may be alternated to reduce pain and spasms.  · Ask your caregiver about trying back exercises and gentle massage. This may be of some benefit.  · Avoid feeling anxious or stressed. Stress increases muscle tension and can worsen back pain. It is important to recognize when you are anxious or stressed and learn ways to manage it. Exercise is a great option.  SEEK MEDICAL CARE IF:  · You have pain that is not relieved with rest or   medicine.  · You have pain that does not improve in 1 week.  · You have new symptoms.  · You are generally not feeling well.  SEEK IMMEDIATE MEDICAL CARE IF:   · You have pain that radiates from your back into your legs.  · You develop new bowel or bladder control problems.  · You have unusual weakness or numbness in your arms or legs.  · You develop nausea or vomiting.  · You develop abdominal pain.  · You feel faint.  Document Released: 02/13/2005 Document Revised: 08/15/2011 Document Reviewed: 07/04/2010  ExitCare® Patient Information ©2014 [employer].

## 2012-08-01 ENCOUNTER — Encounter: Payer: Self-pay | Admitting: Internal Medicine

## 2012-08-01 NOTE — Progress Notes (Signed)
Subjective:    [name], female    DOB: 1960-09-22, 52 y.o.   MRN: [id number]  Back Pain This is a chronic problem. The current episode started more than 1 month ago. The problem occurs intermittently. The problem is unchanged. The pain is present in the lumbar spine. The quality of the pain is described as aching. The pain radiates to the right thigh. The pain is at a severity of 3/10. The pain is moderate. The pain is worse during the day. The symptoms are aggravated by standing and position. Associated symptoms include paresthesias (in her RLE). Pertinent negatives include no abdominal pain, bladder incontinence, bowel incontinence, chest pain, dysuria, fever, headaches, leg pain, numbness, paresis, pelvic pain, perianal numbness, tingling, weakness or weight loss. She has tried NSAIDs, muscle relaxant and analgesics for the symptoms. The treatment provided moderate relief.      Review of Systems  Constitutional: Negative.  Negative for fever, chills, weight loss, diaphoresis, activity change, appetite change, fatigue and unexpected weight change.  HENT: Positive for neck pain. Negative for facial swelling and neck stiffness.   Eyes: Negative.   Respiratory: Negative.  Negative for cough, chest tightness, shortness of breath, wheezing and stridor.   Cardiovascular: Negative.  Negative for chest pain, palpitations and leg swelling.  Gastrointestinal: Negative.  Negative for nausea, vomiting, abdominal pain, diarrhea, constipation and bowel incontinence.  Endocrine: Negative.   Genitourinary: Negative.  Negative for bladder incontinence, dysuria and pelvic pain.  Musculoskeletal: Positive for back pain. Negative for myalgias, joint swelling and gait problem.  Skin: Negative.   Allergic/Immunologic: Negative.   Neurological: Positive for paresthesias (in her RLE). Negative for tingling, weakness, numbness and headaches.  Hematological: Negative.   Psychiatric/Behavioral:  Negative.        Objective:   Physical Exam  Vitals reviewed. Constitutional: She is oriented to person, place, and time. She appears well-developed and well-nourished. No distress.  HENT:  Head: Normocephalic and atraumatic.  Mouth/Throat: Oropharynx is clear and moist. No oropharyngeal exudate.  Eyes: Conjunctivae are normal. Right eye exhibits no discharge. Left eye exhibits no discharge. No scleral icterus.  Neck: Normal range of motion. Neck supple. No JVD present. No tracheal deviation present. No thyromegaly present.  Cardiovascular: Normal rate, regular rhythm, normal heart sounds and intact distal pulses.  Exam reveals no gallop and no friction rub.   No murmur heard. Pulmonary/Chest: Effort normal and breath sounds normal. No stridor. No respiratory distress. She has no wheezes. She has no rales. She exhibits no tenderness.  Abdominal: Soft. Bowel sounds are normal. She exhibits no distension and no mass. There is no tenderness. There is no rebound and no guarding.  Musculoskeletal: Normal range of motion. She exhibits no edema and no tenderness.       Cervical back: Normal.       Lumbar back: Normal.  Lymphadenopathy:    She has no cervical adenopathy.  Neurological: She is alert and oriented to person, place, and time. She has normal strength. She displays no atrophy, no tremor and normal reflexes. No cranial nerve deficit or sensory deficit. She exhibits normal muscle tone. She displays a negative Romberg sign. She displays no seizure activity. Coordination and gait normal.  Reflex Scores:      Tricep reflexes are 1+ on the right side and 1+ on the left side.      Bicep reflexes are 1+ on the right side and 1+ on the left side.      Brachioradialis reflexes are 1+  on the right side and 1+ on the left side.      Patellar reflexes are 1+ on the right side and 1+ on the left side.      Achilles reflexes are 1+ on the right side and 1+ on the left side. Neg SLR in BLE  Skin:  Skin is warm and dry. No rash noted. She is not diaphoretic. No erythema. No pallor.  Psychiatric: She has a normal mood and affect. Her behavior is normal. Judgment and thought content normal.          Assessment & Plan:

## 2012-08-01 NOTE — Assessment & Plan Note (Signed)
She will continue her current meds for pain I have referred her to pain management for further evaluation

## 2012-08-01 NOTE — Assessment & Plan Note (Signed)
She has radicular symptoms and she would like to know if an ESI would help so I have referred her to pain management

## 2012-08-28 ENCOUNTER — Other Ambulatory Visit: Payer: Self-pay | Admitting: Pain Medicine

## 2012-08-28 DIAGNOSIS — M545 Low back pain, unspecified: Secondary | ICD-10-CM

## 2012-08-28 DIAGNOSIS — M542 Cervicalgia: Secondary | ICD-10-CM

## 2012-09-04 ENCOUNTER — Ambulatory Visit
Admission: RE | Admit: 2012-09-04 | Discharge: 2012-09-04 | Disposition: A | Discharge status: Home / Self Care | Payer: BC Managed Care – PPO | Source: Ambulatory Visit | Attending: Pain Medicine | Admitting: Pain Medicine

## 2012-09-04 DIAGNOSIS — M545 Low back pain, unspecified: Secondary | ICD-10-CM

## 2012-09-04 DIAGNOSIS — M542 Cervicalgia: Secondary | ICD-10-CM

## 2012-10-08 ENCOUNTER — Ambulatory Visit: Payer: Self-pay | Admitting: Obstetrics

## 2012-10-14 ENCOUNTER — Ambulatory Visit: Payer: Self-pay | Admitting: Obstetrics

## 2012-11-05 ENCOUNTER — Other Ambulatory Visit: Payer: Self-pay | Admitting: Family Medicine

## 2012-11-05 ENCOUNTER — Ambulatory Visit: Payer: BC Managed Care – PPO

## 2012-11-05 ENCOUNTER — Encounter: Payer: Self-pay | Admitting: Obstetrics

## 2012-11-05 ENCOUNTER — Ambulatory Visit (INDEPENDENT_AMBULATORY_CARE_PROVIDER_SITE_OTHER): Payer: BC Managed Care – PPO | Admitting: Obstetrics

## 2012-11-05 ENCOUNTER — Ambulatory Visit (INDEPENDENT_AMBULATORY_CARE_PROVIDER_SITE_OTHER): Payer: BC Managed Care – PPO | Admitting: Family Medicine

## 2012-11-05 VITALS — BP 143/83 | HR 80 | Temp 96.7°F | Ht 65.0 in | Wt 193.0 lb

## 2012-11-05 DIAGNOSIS — N951 Menopausal and female climacteric states: Secondary | ICD-10-CM | POA: Insufficient documentation

## 2012-11-05 DIAGNOSIS — M79609 Pain in unspecified limb: Secondary | ICD-10-CM

## 2012-11-05 DIAGNOSIS — D509 Iron deficiency anemia, unspecified: Secondary | ICD-10-CM

## 2012-11-05 DIAGNOSIS — N952 Postmenopausal atrophic vaginitis: Secondary | ICD-10-CM

## 2012-11-05 DIAGNOSIS — I10 Essential (primary) hypertension: Secondary | ICD-10-CM

## 2012-11-05 DIAGNOSIS — M20002 Unspecified deformity of left finger(s): Secondary | ICD-10-CM

## 2012-11-05 DIAGNOSIS — M20009 Unspecified deformity of unspecified finger(s): Secondary | ICD-10-CM

## 2012-11-05 LAB — COMPREHENSIVE METABOLIC PANEL
AST: 13 U/L (ref 0–37)
Albumin: 4.3 g/dL (ref 3.5–5.2)
Alkaline Phosphatase: 67 U/L (ref 39–117)
Potassium: 4.1 mEq/L (ref 3.5–5.3)
Sodium: 139 mEq/L (ref 135–145)
Total Bilirubin: 0.3 mg/dL (ref 0.3–1.2)
Total Protein: 7.5 g/dL (ref 6.0–8.3)

## 2012-11-05 LAB — POCT CBC
Granulocyte percent: 63.6 %G (ref 37–80)
MID (cbc): 0.4 (ref 0–0.9)
MPV: 7.8 fL (ref 0–99.8)
POC Granulocyte: 4.3 (ref 2–6.9)
POC LYMPH PERCENT: 30.7 %L (ref 10–50)
POC MID %: 5.7 %M (ref 0–12)
Platelet Count, POC: 339 10*3/uL (ref 142–424)
RBC: 4.3 M/uL (ref 4.04–5.48)
RDW, POC: 21 %

## 2012-11-05 LAB — C-REACTIVE PROTEIN: CRP: 0.7 mg/dL — ABNORMAL HIGH (ref <0.60)

## 2012-11-05 MED ORDER — LISINOPRIL-HYDROCHLOROTHIAZIDE 20-25 MG PO TABS: 1.0000 | ORAL_TABLET | Freq: Every day | ORAL | Status: DC

## 2012-11-05 MED ORDER — OXYCODONE-ACETAMINOPHEN 5-325 MG PO TABS: 1.0000 | ORAL_TABLET | Freq: Two times a day (BID) | ORAL | Status: DC

## 2012-11-05 MED ORDER — FLUOXETINE HCL 20 MG PO CAPS: 20.0000 mg | ORAL_CAPSULE | Freq: Every day | ORAL | Status: DC

## 2012-11-05 NOTE — Patient Instructions (Addendum)
I will be in touch regarding your labs as soon as they come in.  If your hand is getting worse please let me know.

## 2012-11-05 NOTE — Progress Notes (Addendum)
Subjective:     Kathryn Parks is a 52 y.o. female here for a consult to discuss her menopausal symptoms.  Personal health questionnaire reviewed: yes.   Gynecologic History Patient's last menstrual period was 09/12/2012. Contraception: tubal ligation Last Pap: 03/2012. Results were: normal Last mammogram: 2013. Results were: normal  Obstetric History OB History  No data available     The following portions of the patient's history were reviewed and updated as appropriate: allergies, current medications, past family history, past medical history, past social history, past surgical history and problem list.  Review of Systems Pertinent items are noted in HPI.    Objective:    General appearance: alert and no distress  No exam done.  Consultation only.  Assessment:    Postmenopausal symptoms.    Plan:    Education reviewed: calcium supplements, self breast exams and postmenopausal management. Follow up in: 3 months. Prozac Rx.   Estrovera samples dispensed.

## 2012-11-05 NOTE — Progress Notes (Addendum)
[facility] [address] [place] [address] [phone]  Date:  11/05/2012   Name:  Kathryn Parks   DOB:  [birth year]   MRN:  [id number]  PCP:  Sanda Linger, MD    Chief Complaint: Hand Pain   History of Present Illness:  Kathryn Parks is a 52 y.o. very pleasant female patient who presents with the following:  She has been dx with "severe arthritis in my neck and back."  MRI of her cervical and lumbar spine in July of this year which showed severe degenerative changes in her neck.  She had been receiving injections for treatment but found them too painful to continue She has been dx with mild carpal tunnel in both hands a couple of months ago via EMG.   She has had pain in her hands "off and on for the last 2 years." She has had increased pain in her left hand for the last couple of days, and her hand seems stiff.  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   No particular injury to her hands recently.  She is working a reduced schedule.  However, she still does some braiding because "I have to to make a living."  She admits that she has used her hands extensively during her working life, and is afriad she has caused permanent damage.    She has been on oxycodone for about 6 months, she takes just when she is not working.     She was seen here last year per Elpidio Anis, PA-C and prescribed an estrogen cream for atropic vaginitis. She was never able to get this filled due to expense, but thinks her insurance will now cover this for her.  Would like for me to re-send this rx if possible  Colonoscopy was just a few months ago  Patient Active Problem List   Diagnosis Date Noted  . Essential hypertension, benign 05/30/2012  . Neck pain on right side 05/30/2012  . Low back pain radiating to both legs 05/30/2012  . Elbow pain, chronic 05/30/2012    Past Medical History  Diagnosis  Date  . Arthritis   . Hypertension   . Depression   . DDD (degenerative disc disease), lumbar     Past Surgical History  Procedure Laterality Date  . Tubal ligation  1997    History  Substance Use Topics  . Smoking status: Never Smoker   . Smokeless tobacco: Never Used  . Alcohol Use: 1.2 oz/week    2 Glasses of wine per week    Family History  Problem Relation Age of Onset  . Heart disease Father   . Hypertension Father   . Diabetes Father   . Birth defects Neg Hx   . Cancer Neg Hx   . Alcohol abuse Neg Hx   . Drug abuse Neg Hx   . Early death Neg Hx   . Hyperlipidemia Neg Hx   . Kidney disease Neg Hx   . Stroke Neg Hx   . Colon cancer Neg Hx   . Diabetes Mother     No Known Allergies  Medication list has been reviewed and updated.  Current Outpatient Prescriptions on File Prior to Visit  Medication Sig Dispense Refill  . citalopram (CELEXA) 10 MG tablet Take 10 mg by mouth daily.      . cyclobenzaprine (FLEXERIL) 5 MG tablet TAKE 1  TABLET BY MOUTH 3 TIMES A DAY AS NEEDED FOR MUSCLE SPASMS  60 tablet  11  . FLUoxetine (PROZAC) 20 MG capsule Take 1 capsule (20 mg total) by mouth daily.  30 capsule  11  . hydrOXYzine (ATARAX/VISTARIL) 25 MG tablet Take 25 mg by mouth 3 (three) times daily as needed for itching.      Marland Kitchen lisinopril-hydrochlorothiazide (PRINZIDE,ZESTORETIC) 20-25 MG per tablet Take 1 tablet by mouth daily.  30 tablet  11  . loratadine (CLARITIN) 10 MG tablet Take 10 mg by mouth daily.      . naproxen (NAPROSYN) 500 MG tablet Take 1 tablet (500 mg total) by mouth 2 (two) times daily with a meal. As needed for pain  20 tablet  0  . oxyCODONE-acetaminophen (PERCOCET/ROXICET) 5-325 MG per tablet Take 1 tablet by mouth 2 (two) times daily. Ok to fill on or after 08/03/12  60 tablet  0  . valACYclovir (VALTREX) 500 MG tablet Take 1 tablet (500 mg total) by mouth 2 (two) times daily.  30 tablet  1  . estradiol (ESTRACE) 0.1 MG/GM vaginal cream Place 0.25  Applicatorfuls vaginally daily.  42.5 g  0  . lisinopril (PRINIVIL,ZESTRIL) 20 MG tablet 20 mg. Take 1/2 tab daily       No current facility-administered medications on file prior to visit.    Review of Systems:  As per HPI- otherwise negative.   Physical Examination: Filed Vitals:   11/05/12 1541  BP: 120/72  Pulse: 80  Temp: 98 F (36.7 C)  Resp: 18   Filed Vitals:   11/05/12 1541  Height: 5\' 6"  (1.676 m)  Weight: 194 lb (87.998 kg)   Body mass index is 31.33 kg/(m^2). Ideal Body Weight: Weight in (lb) to have BMI = 25: 154.6  GEN: WDWN, NAD, Non-toxic, A & O x 3, overweight HEENT: Atraumatic, Normocephalic. Neck supple. No masses, No LAD. Ears and Nose: No external deformity. CV: RRR, No M/G/R. No JVD. No thrill. No extra heart sounds. PULM: CTA B, no wheezes, crackles, rhonchi. No retractions. No resp. distress. No accessory muscle use. EXTR: No c/c/e NEURO Normal gait.  PSYCH: Normally interactive. Conversant. Not depressed or anxious appearing.  Calm demeanor.  Left hand:the left long and ring fingers are hyperextended at the DIP and flexed at the PIP, in a boutonierre deformity.  She has tenderness over the fingers but no swelling or joint nodules.  No heat.  Normal perfusion.    [facility] reading (PRIMARY) by  Dr. Patsy Lager. Left hand: negative Right hand: negative  LEFT HAND - COMPLETE 3+ VIEW  Comparison: None.  Findings: No fracture or dislocation is noted. Joint spaces are intact. No soft tissue abnormality is noted.  IMPRESSION: Normal left hand.  Clinically significant discrepancy from primary report, if provided: None  RIGHT HAND - COMPLETE 3+ VIEW  Comparison: None.  Findings: No fracture or dislocation is noted. Joint spaces are intact. No soft tissue abnormality is noted.  IMPRESSION: Normal right hand.  Clinically significant discrepancy from primary report, if provided: None  Results for orders placed in visit on 11/05/12  POCT CBC       Result Value Range   WBC 6.7  4.6 - 10.2 K/uL   Lymph, poc 2.1  0.6 - 3.4   POC LYMPH PERCENT 30.7  10 - 50 %L   MID (cbc) 0.4  0 - 0.9   POC MID % 5.7  0 - 12 %M   POC Granulocyte 4.3  2 -  6.9   Granulocyte percent 63.6  37 - 80 %G   RBC 4.30  4.04 - 5.48 M/uL   Hemoglobin 10.1 (*) 12.2 - 16.2 g/dL   HCT, POC 16.1 (*) 09.6 - 47.9 %   MCV 77.8 (*) 80 - 97 fL   MCH, POC 23.5 (*) 27 - 31.2 pg   MCHC 30.1 (*) 31.8 - 35.4 g/dL   RDW, POC 04.5     Platelet Count, POC 339  142 - 424 K/uL   MPV 7.8  0 - 99.8 fL  POCT SEDIMENTATION RATE      Result Value Range   POCT SED RATE 30 (*) 0 - 22 mm/hr    Assessment and Plan: Hand pain, unspecified laterality - Plan: DG Hand Complete Left, DG Hand Complete Right, Ambulatory referral to Hand Surgery, oxyCODONE-acetaminophen (PERCOCET/ROXICET) 5-325 MG per tablet  Finger deformity, acquired, left - Plan: POCT CBC, POCT SEDIMENTATION RATE, Comprehensive metabolic panel, ANA, Cyclic Citrul Peptide Antibody, IGG, C-reactive protein, Rheumatoid factor  Chronic hand problems, now with more severe pain for the last few days.  Appears to have some contracture or chronic changes in the left hand.  Referral to hand surgery for evaluation while I await RA work- up labs.  Did refill her oxycodone per her request.   If getting worse she is to let me know   Signed Abbe Amsterdam, MD  9/11; called with update regarding her labs  Results for orders placed in visit on 11/05/12  COMPREHENSIVE METABOLIC PANEL      Result Value Range   Sodium 139  135 - 145 mEq/L   Potassium 4.1  3.5 - 5.3 mEq/L   Chloride 106  96 - 112 mEq/L   CO2 25  19 - 32 mEq/L   Glucose, Bld 106 (*) 70 - 99 mg/dL   BUN 9  6 - 23 mg/dL   Creat 4.09  8.11 - 9.14 mg/dL   Total Bilirubin 0.3  0.3 - 1.2 mg/dL   Alkaline Phosphatase 67  39 - 117 U/L   AST 13  0 - 37 U/L   ALT 13  0 - 35 U/L   Total Protein 7.5  6.0 - 8.3 g/dL   Albumin 4.3  3.5 - 5.2 g/dL   Calcium 9.5  8.4 - 78.2  mg/dL  ANA      Result Value Range   ANA NEG  NEGATIVE  CYCLIC CITRUL PEPTIDE ANTIBODY, IGG      Result Value Range   Cyclic Citrullin Peptide Ab <9.5  0.0 - 5.0 U/mL  C-REACTIVE PROTEIN      Result Value Range   CRP 0.7 (*) <0.60 mg/dL  RHEUMATOID FACTOR      Result Value Range   Rheumatoid Factor <10  <=14 IU/mL  POCT CBC      Result Value Range   WBC 6.7  4.6 - 10.2 K/uL   Lymph, poc 2.1  0.6 - 3.4   POC LYMPH PERCENT 30.7  10 - 50 %L   MID (cbc) 0.4  0 - 0.9   POC MID % 5.7  0 - 12 %M   POC Granulocyte 4.3  2 - 6.9   Granulocyte percent 63.6  37 - 80 %G   RBC 4.30  4.04 - 5.48 M/uL   Hemoglobin 10.1 (*) 12.2 - 16.2 g/dL   HCT, POC 62.1 (*) 30.8 - 47.9 %   MCV 77.8 (*) 80 - 97 fL   MCH, POC 23.5 (*)  27 - 31.2 pg   MCHC 30.1 (*) 31.8 - 35.4 g/dL   RDW, POC 16.1     Platelet Count, POC 339  142 - 424 K/uL   MPV 7.8  0 - 99.8 fL  POCT SEDIMENTATION RATE      Result Value Range   POCT SED RATE 30 (*) 0 - 22 mm/hr    Called 9/11 and went over labs.  Anemia- suspect iron deficiency.  Will have lab add on a ferritin.  Do not think that she has an autoimmune disorder given her labs- all ok except minimally elevated CRP

## 2012-11-06 LAB — CYCLIC CITRUL PEPTIDE ANTIBODY, IGG: Cyclic Citrullin Peptide Ab: <2 U/mL (ref 0.0–5.0)

## 2012-11-07 LAB — FERRITIN: Ferritin: 2 ng/mL — ABNORMAL LOW (ref 10–291)

## 2012-11-07 MED ORDER — ESTRADIOL 0.1 MG/GM VA CREA: TOPICAL_CREAM | VAGINAL | Status: DC

## 2012-11-07 NOTE — Addendum Note (Signed)
Addended by: Abbe Amsterdam C on: 11/07/2012 03:06 PM   Modules accepted: Orders

## 2012-11-09 ENCOUNTER — Telehealth: Payer: Self-pay | Admitting: Family Medicine

## 2012-11-09 DIAGNOSIS — D509 Iron deficiency anemia, unspecified: Secondary | ICD-10-CM

## 2012-11-09 MED ORDER — FERROUS SULFATE 325 (65 FE) MG PO TABS: 325.0000 mg | ORAL_TABLET | Freq: Every day | ORAL | Status: DC

## 2012-11-09 MED ORDER — FERROUS SULFATE 325 (65 FE) MG PO TABS: 325.0000 mg | ORAL_TABLET | Freq: Two times a day (BID) | ORAL | Status: DC

## 2012-11-09 NOTE — Telephone Encounter (Signed)
Called and d/w pt.  We need to put her on iron BID.  If constipation occurs decrease dose or add colace.  Recheck in 4- 6 weeks

## 2012-12-31 ENCOUNTER — Other Ambulatory Visit: Payer: Self-pay | Admitting: Internal Medicine

## 2012-12-31 ENCOUNTER — Ambulatory Visit (INDEPENDENT_AMBULATORY_CARE_PROVIDER_SITE_OTHER): Payer: BC Managed Care – PPO | Admitting: Physician Assistant

## 2012-12-31 VITALS — BP 120/72 | HR 72 | Temp 97.7°F | Resp 16 | Wt 194.0 lb

## 2012-12-31 DIAGNOSIS — L299 Pruritus, unspecified: Secondary | ICD-10-CM

## 2012-12-31 DIAGNOSIS — L500 Allergic urticaria: Secondary | ICD-10-CM

## 2012-12-31 MED ORDER — PREDNISONE 20 MG PO TABS: ORAL_TABLET | ORAL | Status: DC

## 2012-12-31 MED ORDER — HYDROXYZINE HCL 25 MG PO TABS: 25.0000 mg | ORAL_TABLET | Freq: Three times a day (TID) | ORAL | Status: DC | PRN

## 2012-12-31 NOTE — Patient Instructions (Signed)
Take the Claritin in the morning and use the Hydroxyzine at bed time Take the Prednisone this afternoon with food, but starting tomorrow take it each morning with breakfast Drink lots and lots of fluids Keep cool and dry as best you can, as getting hot and sweaty can make itching worse

## 2012-12-31 NOTE — Progress Notes (Signed)
  Subjective:    [name], female    DOB: 01-Feb-1961, 52 y.o.   MRN: [id number]  HPI Kathryn Parks is a 52 YO female with a history of severe allergic reaction presents today with a 3 day history of an allergic reaction located on her scalp and ears following dying her hair this past Friday on 12/27/12.  The patient states she used a Bagean/Oriental hair dye at home Friday night and began experiencing worsening itching, redness, burning, and swelling to her entire scalp that started on Sunday afternoon (12/29/12)as well as recently having very mild neck swelling.  She denies any problems breathing or trouble swallowing.  She states she had a previous allergic episode 10 years ago in which she was sent to the emergency room due to having trouble breathing.  She has taken one tablet of Benadryl last night and one tablet this morning with mild relief of itching.    Past Medical History  Diagnosis Date  . Arthritis   . Hypertension   . Depression   . DDD (degenerative disc disease), lumbar     Review of Systems  Constitutional: Negative for fever, chills, appetite change and fatigue.  HENT: Positive for facial swelling. Negative for congestion, ear pain, rhinorrhea, sinus pressure, sneezing, sore throat, trouble swallowing and voice change.        Objective:   Physical Exam Kathryn Parks  is a pleasant well-developed, well-nourished African American female  in no acute distress Heart: Normal rate and rhythm, no murmurs, rubs, or gallops Lungs: Clear to auscultation bilaterally, no wheezing, rhonchi, or rales Skin: Scalp with erythematous raised patches consistent with wheals located diffusely across scalp but most prominent on medial line and surrounding ears bilaterally.  EENT: External ear and ear canals clear with no erythema noted and TM visible bilaterally.  Nasal mucosa non-erythematous and non-swollen.  Oral pharynx non-erythematous with no tonsillar swelling exudate.  Neck  supple with no cervical lymphadenopathy or swelling present bilaterally.    BP 120/72  Pulse 72  Temp(Src) 97.7 F (36.5 C) (Oral)  Resp 16  Wt 194 lb (87.998 kg)  SpO2 100%       Assessment & Plan:  Allergic urticaria - Plan: predniSONE (DELTASONE) 20 MG tablet  Itching - Plan: hydrOXYzine (ATARAX/VISTARIL) 25 MG tablet  Please call [phone] if you begin to have trouble swallowing or breathing  Patient Instructions  Take the Claritin in the morning and use the Hydroxyzine at bed time Take the Prednisone this afternoon with food, but starting tomorrow take it each morning with breakfast Drink lots and lots of fluids Keep cool and dry as best you can, as getting hot and sweaty can make itching worse

## 2013-01-01 ENCOUNTER — Encounter: Payer: Self-pay | Admitting: Physician Assistant

## 2013-01-01 NOTE — Progress Notes (Signed)
I have examined this patient along with the student and agree.  

## 2013-02-04 ENCOUNTER — Ambulatory Visit (INDEPENDENT_AMBULATORY_CARE_PROVIDER_SITE_OTHER): Payer: BC Managed Care – PPO | Admitting: Obstetrics

## 2013-02-04 ENCOUNTER — Encounter: Payer: Self-pay | Admitting: Obstetrics

## 2013-02-04 VITALS — BP 156/79 | HR 75 | Temp 97.7°F | Wt 197.0 lb

## 2013-02-04 DIAGNOSIS — N760 Acute vaginitis: Secondary | ICD-10-CM

## 2013-02-04 DIAGNOSIS — D649 Anemia, unspecified: Secondary | ICD-10-CM

## 2013-02-04 DIAGNOSIS — A6000 Herpesviral infection of urogenital system, unspecified: Secondary | ICD-10-CM

## 2013-02-04 DIAGNOSIS — N951 Menopausal and female climacteric states: Secondary | ICD-10-CM | POA: Insufficient documentation

## 2013-02-04 DIAGNOSIS — N920 Excessive and frequent menstruation with regular cycle: Secondary | ICD-10-CM

## 2013-02-04 MED ORDER — VALACYCLOVIR HCL 500 MG PO TABS: 500.0000 mg | ORAL_TABLET | Freq: Every day | ORAL | Status: DC

## 2013-02-04 MED ORDER — ESTRADIOL-LEVONORGESTREL 0.045-0.015 MG/DAY TD PTWK: 1.0000 | MEDICATED_PATCH | TRANSDERMAL | Status: DC

## 2013-02-04 MED ORDER — PNV PRENATAL PLUS MULTIVITAMIN 27-1 MG PO TABS: 1.0000 | ORAL_TABLET | Freq: Every day | ORAL | Status: DC

## 2013-02-04 NOTE — Progress Notes (Signed)
Subjective:     Kathryn Parks is a 52 y.o. female here for a routine exam.  Current complaints: follow up. Pt was in the office in September 2014 for menopausal symptoms. Pt states her symptoms are not improving. Pt states she is having vaginal irritation. Pt states she is having vaginal burning and itching. Pt states she is also having vaginal dryness.  Personal health questionnaire reviewed: yes.   Gynecologic History No LMP recorded. [name]. Contraception: tubal ligation  Obstetric History OB History  No data available     The following portions of the patient's history were reviewed and updated as appropriate: allergies, current medications, past family history, past medical history, past social history, past surgical history and problem list.  Review of Systems Pertinent items are noted in HPI.    Objective:    General appearance: alert and no distress Abdomen: normal findings: soft, non-tender Pelvic: cervix normal in appearance, external genitalia normal, no adnexal masses or tenderness, no cervical motion tenderness, rectovaginal septum normal, uterus normal size, shape, and consistency and vagina normal without discharge Extremities: extremities normal, atraumatic, no cyanosis or edema    Assessment:    Menopausal symptoms.  H/O heavy periods, but irregular.   Plan:    Education reviewed: Management of the menopause.. Follow up in: 2 weeks. Ultrasound ordered. Climara Pro Rx.  Start after U/S and endometrial biopsy.

## 2013-02-05 ENCOUNTER — Encounter: Payer: Self-pay | Admitting: Obstetrics

## 2013-02-05 LAB — WET PREP BY MOLECULAR PROBE
Candida species: NEGATIVE
Gardnerella vaginalis: NEGATIVE

## 2013-02-25 ENCOUNTER — Other Ambulatory Visit: Payer: Self-pay | Admitting: Obstetrics

## 2013-02-25 ENCOUNTER — Ambulatory Visit (INDEPENDENT_AMBULATORY_CARE_PROVIDER_SITE_OTHER): Payer: BC Managed Care – PPO

## 2013-02-25 DIAGNOSIS — N946 Dysmenorrhea, unspecified: Secondary | ICD-10-CM

## 2013-02-25 DIAGNOSIS — N920 Excessive and frequent menstruation with regular cycle: Secondary | ICD-10-CM

## 2013-02-25 DIAGNOSIS — D649 Anemia, unspecified: Secondary | ICD-10-CM

## 2013-02-25 DIAGNOSIS — R42 Dizziness and giddiness: Secondary | ICD-10-CM

## 2013-02-25 MED ORDER — MECLIZINE HCL 32 MG PO TABS: 32.0000 mg | ORAL_TABLET | Freq: Three times a day (TID) | ORAL | Status: DC | PRN

## 2013-03-03 ENCOUNTER — Encounter: Payer: Self-pay | Admitting: Obstetrics

## 2013-03-04 ENCOUNTER — Ambulatory Visit (INDEPENDENT_AMBULATORY_CARE_PROVIDER_SITE_OTHER): Payer: No Typology Code available for payment source | Admitting: Obstetrics

## 2013-03-04 DIAGNOSIS — D259 Leiomyoma of uterus, unspecified: Secondary | ICD-10-CM

## 2013-03-04 DIAGNOSIS — N920 Excessive and frequent menstruation with regular cycle: Secondary | ICD-10-CM

## 2013-03-04 NOTE — Progress Notes (Signed)
Subjective:     Kathryn Parks is a 53 y.o. female here for a follow up exam.  Current complaints: Pt in office today for u/s follow up. Fibroids noted in u/s.  Personal health questionnaire reviewed: yes.   Gynecologic History No LMP recorded. [name].   Obstetric History OB History  Gravida Para Term Preterm AB SAB TAB Ectopic Multiple Living  1 1 1  0 0 0 0 0 0 1    # Outcome Date GA Lbr Len/2nd Weight Sex Delivery Anes PTL Lv  1 TRM                The following portions of the patient's history were reviewed and updated as appropriate: allergies, current medications, past family history, past medical history, past social history, past surgical history and problem list.  Review of Systems Pertinent items are noted in HPI.    Objective:    No exam performed today, Consult.    Assessment:    Uterine fibroids.  Stable.  ? Endometrial Hyperplasia on U/S.  Will review with Radiologist.  Hot flashes.  Improved with HRT.   Plan:    Education reviewed: Management of Endometrial Hyperplasia.. Follow up in: 2 weeks. Schedule Endometrial Biopsy.

## 2013-04-28 ENCOUNTER — Encounter: Payer: Self-pay | Admitting: Obstetrics

## 2013-04-28 ENCOUNTER — Ambulatory Visit (INDEPENDENT_AMBULATORY_CARE_PROVIDER_SITE_OTHER): Payer: No Typology Code available for payment source | Admitting: Obstetrics

## 2013-04-28 VITALS — BP 140/89 | HR 86 | Temp 97.2°F | Wt 197.0 lb

## 2013-04-28 DIAGNOSIS — Z01419 Encounter for gynecological examination (general) (routine) without abnormal findings: Secondary | ICD-10-CM

## 2013-04-28 DIAGNOSIS — E669 Obesity, unspecified: Secondary | ICD-10-CM

## 2013-04-28 DIAGNOSIS — Z124 Encounter for screening for malignant neoplasm of cervix: Secondary | ICD-10-CM

## 2013-04-28 DIAGNOSIS — Z1239 Encounter for other screening for malignant neoplasm of breast: Secondary | ICD-10-CM | POA: Insufficient documentation

## 2013-04-28 DIAGNOSIS — Z113 Encounter for screening for infections with a predominantly sexual mode of transmission: Secondary | ICD-10-CM

## 2013-04-28 DIAGNOSIS — N8500 Endometrial hyperplasia, unspecified: Secondary | ICD-10-CM | POA: Insufficient documentation

## 2013-04-28 DIAGNOSIS — D219 Benign neoplasm of connective and other soft tissue, unspecified: Secondary | ICD-10-CM | POA: Insufficient documentation

## 2013-04-28 MED ORDER — PHENTERMINE HCL 37.5 MG PO CAPS
37.5000 mg | ORAL_CAPSULE | ORAL | Status: DC
Start: 2013-04-28 — End: 2013-10-06

## 2013-04-28 NOTE — Progress Notes (Signed)
Subjective:     Kathryn Parks is a 53 y.o. female here for a routine exam.  Current complaints: annual exam.  Personal health questionnaire reviewed: yes.   Gynecologic History No LMP recorded. [name]. Contraception: none Last Pap: 04/2012. Results were: normal Last mammogram: 04/2012. Results were: normal  Obstetric History OB History  Gravida Para Term Preterm AB SAB TAB Ectopic Multiple Living  1 1 1  0 0 0 0 0 0 1    # Outcome Date GA Lbr Len/2nd Weight Sex Delivery Anes PTL Lv  1 TRM 02/03/95 [redacted]w[redacted]d  6 lb 9 oz (2.977 kg) F SVD EPI  Y       The following portions of the patient's history were reviewed and updated as appropriate: allergies, current medications, past family history, past medical history, past social history, past surgical history and problem list.  Review of Systems Pertinent items are noted in HPI.    Objective:    General appearance: alert and no distress Breasts: normal appearance, no masses or tenderness Abdomen: normal findings: soft, non-tender Pelvic: cervix normal in appearance, external genitalia normal, no adnexal masses or tenderness, no cervical motion tenderness, rectovaginal septum normal, uterus normal size, shape, and consistency and vagina normal without discharge    Assessment:    Healthy female exam.    Plan:    Education reviewed: self breast exams and management of endometrial hyperplasia. Hormone replacement therapy: hormone replacement therapy: Climara Pro, risks and benefits reviewed, prescription for 12 months and follow up appointment recommended. Follow up in: 2 weeks.  Endometrial Biopsy before starting HRT.

## 2013-04-29 LAB — PAP IG W/ RFLX HPV ASCU

## 2013-04-29 LAB — WET PREP BY MOLECULAR PROBE
Candida species: NEGATIVE
Gardnerella vaginalis: NEGATIVE
Trichomonas vaginosis: NEGATIVE

## 2013-05-12 ENCOUNTER — Ambulatory Visit: Payer: No Typology Code available for payment source | Admitting: Obstetrics

## 2013-05-28 ENCOUNTER — Ambulatory Visit: Payer: No Typology Code available for payment source | Admitting: Obstetrics

## 2013-06-09 ENCOUNTER — Encounter: Payer: Self-pay | Admitting: Obstetrics

## 2013-06-09 ENCOUNTER — Ambulatory Visit (INDEPENDENT_AMBULATORY_CARE_PROVIDER_SITE_OTHER): Payer: No Typology Code available for payment source | Admitting: Obstetrics

## 2013-06-09 ENCOUNTER — Other Ambulatory Visit: Payer: Self-pay | Admitting: Obstetrics

## 2013-06-09 VITALS — BP 136/83 | HR 70 | Temp 98.7°F | Ht 65.0 in | Wt 197.0 lb

## 2013-06-09 DIAGNOSIS — N8500 Endometrial hyperplasia, unspecified: Secondary | ICD-10-CM

## 2013-06-09 DIAGNOSIS — N920 Excessive and frequent menstruation with regular cycle: Secondary | ICD-10-CM

## 2013-06-09 DIAGNOSIS — N951 Menopausal and female climacteric states: Secondary | ICD-10-CM

## 2013-06-09 NOTE — Progress Notes (Signed)
Endometrial Biopsy Procedure Note  Pre-operative Diagnosis: AUB.  Perimenopausal.  Post-operative Diagnosis: same  Indications: abnormal uterine bleeding  Procedure Details   Urine pregnancy test was not done.  The risks (including infection, bleeding, pain, and uterine perforation) and benefits of the procedure were explained to the patient and Written informed consent was obtained.  Antibiotic prophylaxis against endocarditis was not indicated.   The patient was placed in the dorsal lithotomy position.  Bimanual exam showed the uterus to be in the neutral position.  A Polka' speculum inserted in the vagina, and the cervix prepped with povidone iodine.  Endocervical curettage with a Kevorkian curette was not performed.   A sharp tenaculum was applied to the anterior lip of the cervix for stabilization.  A sterile uterine sound was used to sound the uterus to a depth of 11cm.  A Pipelle endometrial aspirator was used to sample the endometrium.  Sample was sent for pathologic examination.  Condition: Stable  Complications: None  Plan:  The patient was advised to call for any fever or for prolonged or severe pain or bleeding. She was advised to use NSAID as needed for mild to moderate pain. She was advised to avoid vaginal intercourse for 48 hours or until the bleeding has completely stopped.  Attending Physician Documentation: I was present for or participated in the entire procedure, including opening and closing.

## 2013-06-12 ENCOUNTER — Encounter: Payer: Self-pay | Admitting: Obstetrics

## 2013-06-23 ENCOUNTER — Ambulatory Visit (INDEPENDENT_AMBULATORY_CARE_PROVIDER_SITE_OTHER): Payer: No Typology Code available for payment source | Admitting: Obstetrics

## 2013-06-23 VITALS — BP 147/85 | HR 73 | Temp 98.5°F

## 2013-06-23 DIAGNOSIS — N951 Menopausal and female climacteric states: Secondary | ICD-10-CM

## 2013-06-23 DIAGNOSIS — N939 Abnormal uterine and vaginal bleeding, unspecified: Principal | ICD-10-CM

## 2013-06-23 DIAGNOSIS — N946 Dysmenorrhea, unspecified: Secondary | ICD-10-CM | POA: Insufficient documentation

## 2013-06-23 DIAGNOSIS — N926 Irregular menstruation, unspecified: Secondary | ICD-10-CM | POA: Insufficient documentation

## 2013-06-23 MED ORDER — MEDROXYPROGESTERONE ACETATE 10 MG PO TABS: 10.0000 mg | ORAL_TABLET | Freq: Every day | ORAL | Status: DC

## 2013-06-23 MED ORDER — IBUPROFEN 800 MG PO TABS: 800.0000 mg | ORAL_TABLET | Freq: Three times a day (TID) | ORAL | Status: DC | PRN

## 2013-06-23 NOTE — Progress Notes (Signed)
Patient ID: Zeinab Rodwell, female   DOB: [birth year], 53 y.o.   MRN: [id number]  Chief Complaint  Patient presents with  . Follow-up    endometrial biopsy    HPI Kristen Fromm is a 53 y.o. female.  H/O AUB and severe dysmenorrhea.  Perimenopausal, but periods are irregular and heavy.  Ultrasound revealed Endometrial Hyperplasia    ( 23 mm ) .  Endometrial Biopsy done  She is here to discuss results of Endometrial Biopsy and further management options. HPI  Past Medical History  Diagnosis Date  . Arthritis   . Hypertension   . Depression   . DDD (degenerative disc disease), lumbar     Past Surgical History  Procedure Laterality Date  . Tubal ligation  1997    Family History  Problem Relation Age of Onset  . Heart disease Father   . Hypertension Father   . Diabetes Father   . Birth defects Neg Hx   . Cancer Neg Hx   . Alcohol abuse Neg Hx   . Drug abuse Neg Hx   . Early death Neg Hx   . Hyperlipidemia Neg Hx   . Kidney disease Neg Hx   . Stroke Neg Hx   . Colon cancer Neg Hx   . Diabetes Mother     Social History History  Substance Use Topics  . Smoking status: Never Smoker   . Smokeless tobacco: Never Used  . Alcohol Use: 0.0 oz/week     Comment: Ocassionally    No Known Allergies  Current Outpatient Prescriptions  Medication Sig Dispense Refill  . citalopram (CELEXA) 10 MG tablet Take 10 mg by mouth daily.      . cyclobenzaprine (FLEXERIL) 5 MG tablet TAKE 1 TABLET BY MOUTH 3 TIMES A DAY AS NEEDED FOR MUSCLE SPASMS  60 tablet  11  . estradiol (ESTRACE) 0.1 MG/GM vaginal cream Place one gram cream vaginally 2x weekly.  Taper down as tolerated.  42.5 g  2  . estradiol-levonorgestrel (CLIMARA PRO) 0.045-0.015 MG/DAY Place 1 patch onto the skin once a week.  4 patch  12  . FLUoxetine (PROZAC) 20 MG capsule Take 1 capsule (20 mg total) by mouth daily.  30 capsule  11  . hydrOXYzine (ATARAX/VISTARIL) 25 MG tablet Take 1 tablet (25 mg total) by mouth 3 (three)  times daily as needed for itching.  30 tablet  0  . ibuprofen (ADVIL,MOTRIN) 800 MG tablet Take 1 tablet (800 mg total) by mouth every 8 (eight) hours as needed.  30 tablet  5  . lisinopril (PRINIVIL,ZESTRIL) 20 MG tablet 20 mg. Take 1/2 tab daily      . meclizine (ANTIVERT) 32 MG tablet Take 1 tablet (32 mg total) by mouth 3 (three) times daily as needed for dizziness.  30 tablet  1  . medroxyPROGESTERone (PROVERA) 10 MG tablet Take 1 tablet (10 mg total) by mouth daily.  30 tablet  0  . naproxen (NAPROSYN) 500 MG tablet TAKE 1 TABLET BY MOUTH DAILY  30 tablet  2  . oxyCODONE-acetaminophen (PERCOCET/ROXICET) 5-325 MG per tablet Take 1 tablet by mouth 2 (two) times daily. Ok to fill on or after 08/03/12  30 tablet  0  . phentermine 37.5 MG capsule Take 1 capsule (37.5 mg total) by mouth every morning.  30 capsule  2  . predniSONE (DELTASONE) 20 MG tablet Take 3 PO QAM x3days, 2 PO QAM x3days, 1 PO QAM x3days  18 tablet  0  . Prenatal Vit-Fe  Fumarate-FA (PNV PRENATAL PLUS MULTIVITAMIN) 27-1 MG TABS Take 1 tablet by mouth daily before breakfast.  30 tablet  11  . valACYclovir (VALTREX) 500 MG tablet Take 1 tablet (500 mg total) by mouth 2 (two) times daily.  30 tablet  1   No current facility-administered medications for this visit.    Review of Systems Review of Systems Constitutional: negative for fatigue and weight loss Respiratory: negative for cough and wheezing Cardiovascular: negative for chest pain, fatigue and palpitations Gastrointestinal: negative for abdominal pain and change in bowel habits Genitourinary:negative Integument/breast: negative for nipple discharge Musculoskeletal:negative for myalgias Neurological: negative for gait problems and tremors Behavioral/Psych: negative for abusive relationship, depression Endocrine: negative for temperature intolerance     Blood pressure 147/85, pulse 73, temperature 98.5 F (36.9 C), last menstrual period 05/12/2013.  Physical  Exam Physical Exam PE:  Deferred Data Reviewed Ultrasound Endometrial Biopsy:  Normal  Assessment    AUB Dysmenorrhea Perimenopause     Plan    No orders of the defined types were placed in this encounter.   Meds ordered this encounter  Medications  . ibuprofen (ADVIL,MOTRIN) 800 MG tablet    Sig: Take 1 tablet (800 mg total) by mouth every 8 (eight) hours as needed.    Dispense:  30 tablet    Refill:  5  . medroxyPROGESTERone (PROVERA) 10 MG tablet    Sig: Take 1 tablet (10 mg total) by mouth daily.    Dispense:  30 tablet    Refill:  0    Need to obtain previous records Possible management options include:  Provera Rx for Endometrial Hyperplasia and AUB.  Ibuprofen Rx for Dysmenorrhea. Follow up in 3 months.  Repeat ultrasound in 6 weeks. Patient will probably need a Hysteroscopy and D&C.        Shelly Bombard 06/23/2013, 2:18 PM

## 2013-09-22 ENCOUNTER — Ambulatory Visit (INDEPENDENT_AMBULATORY_CARE_PROVIDER_SITE_OTHER): Payer: No Typology Code available for payment source | Admitting: Obstetrics

## 2013-09-22 VITALS — BP 145/95 | HR 80 | Temp 98.2°F | Wt 197.0 lb

## 2013-09-22 DIAGNOSIS — Z113 Encounter for screening for infections with a predominantly sexual mode of transmission: Secondary | ICD-10-CM

## 2013-09-22 DIAGNOSIS — R52 Pain, unspecified: Secondary | ICD-10-CM

## 2013-09-22 MED ORDER — OXYCODONE HCL 10 MG PO TABS: 10.0000 mg | ORAL_TABLET | Freq: Four times a day (QID) | ORAL | Status: DC | PRN

## 2013-09-23 ENCOUNTER — Encounter: Payer: Self-pay | Admitting: Obstetrics

## 2013-09-23 LAB — HEPATITIS B SURFACE ANTIGEN: HEP B S AG: NEGATIVE

## 2013-09-23 LAB — HEPATITIS C ANTIBODY: HCV AB: NEGATIVE

## 2013-09-23 LAB — HIV ANTIBODY (ROUTINE TESTING W REFLEX): HIV 1&2 Ab, 4th Generation: NONREACTIVE

## 2013-09-23 LAB — RPR

## 2013-09-23 NOTE — Progress Notes (Signed)
[name], female   DOB: 1960-07-17, 53 y.o.   MRN: [id number]  Chief Complaint  Patient presents with  . Follow-up    3 month    HPI Kathryn Parks is a 53 y.o. female.  Presents for results of endometrial biopsy.  HPI  Past Medical History  Diagnosis Date  . Arthritis   . Hypertension   . Depression   . DDD (degenerative disc disease), lumbar     Past Surgical History  Procedure Laterality Date  . Tubal ligation  1997    Family History  Problem Relation Age of Onset  . Heart disease Father   . Hypertension Father   . Diabetes Father   . Birth defects Neg Hx   . Cancer Neg Hx   . Alcohol abuse Neg Hx   . Drug abuse Neg Hx   . Early death Neg Hx   . Hyperlipidemia Neg Hx   . Kidney disease Neg Hx   . Stroke Neg Hx   . Colon cancer Neg Hx   . Diabetes Mother     Social History History  Substance Use Topics  . Smoking status: Never Smoker   . Smokeless tobacco: Never Used  . Alcohol Use: 0.0 oz/week     Comment: Ocassionally    No Known Allergies  Current Outpatient Prescriptions  Medication Sig Dispense Refill  . citalopram (CELEXA) 10 MG tablet Take 10 mg by mouth daily.      . cyclobenzaprine (FLEXERIL) 5 MG tablet TAKE 1 TABLET BY MOUTH 3 TIMES A DAY AS NEEDED FOR MUSCLE SPASMS  60 tablet  11  . estradiol (ESTRACE) 0.1 MG/GM vaginal cream Place one gram cream vaginally 2x weekly.  Taper down as tolerated.  42.5 g  2  . estradiol-levonorgestrel (CLIMARA PRO) 0.045-0.015 MG/DAY Place 1 patch onto the skin once a week.  4 patch  12  . FLUoxetine (PROZAC) 20 MG capsule Take 1 capsule (20 mg total) by mouth daily.  30 capsule  11  . hydrOXYzine (ATARAX/VISTARIL) 25 MG tablet Take 1 tablet (25 mg total) by mouth 3 (three) times daily as needed for itching.  30 tablet  0  . ibuprofen (ADVIL,MOTRIN) 800 MG tablet Take 1 tablet (800 mg total) by mouth every 8 (eight) hours as needed.  30 tablet  5  . lisinopril (PRINIVIL,ZESTRIL) 20 MG tablet 20  mg. Take 1/2 tab daily      . meclizine (ANTIVERT) 32 MG tablet Take 1 tablet (32 mg total) by mouth 3 (three) times daily as needed for dizziness.  30 tablet  1  . medroxyPROGESTERone (PROVERA) 10 MG tablet Take 1 tablet (10 mg total) by mouth daily.  30 tablet  0  . naproxen (NAPROSYN) 500 MG tablet TAKE 1 TABLET BY MOUTH DAILY  30 tablet  2  . Oxycodone HCl 10 MG TABS Take 1 tablet (10 mg total) by mouth every 6 (six) hours as needed.  40 tablet  0  . oxyCODONE-acetaminophen (PERCOCET/ROXICET) 5-325 MG per tablet Take 1 tablet by mouth 2 (two) times daily. Ok to fill on or after 08/03/12  30 tablet  0  . phentermine 37.5 MG capsule Take 1 capsule (37.5 mg total) by mouth every morning.  30 capsule  2  . predniSONE (DELTASONE) 20 MG tablet Take 3 PO QAM x3days, 2 PO QAM x3days, 1 PO QAM x3days  18 tablet  0  . Prenatal Vit-Fe Fumarate-FA (PNV PRENATAL PLUS MULTIVITAMIN) 27-1 MG TABS Take 1 tablet  by mouth daily before breakfast.  30 tablet  11  . valACYclovir (VALTREX) 500 MG tablet Take 1 tablet (500 mg total) by mouth 2 (two) times daily.  30 tablet  1   No current facility-administered medications for this visit.    Review of Systems Review of Systems Constitutional: negative for fatigue and weight loss Respiratory: negative for cough and wheezing Cardiovascular: negative for chest pain, fatigue and palpitations Gastrointestinal: negative for abdominal pain and change in bowel habits Genitourinary:negative Integument/breast: negative for nipple discharge Musculoskeletal:negative for myalgias Neurological: negative for gait problems and tremors Behavioral/Psych: negative for abusive relationship, depression Endocrine: negative for temperature intolerance     Blood pressure 145/95, pulse 80, temperature 98.2 F (36.8 C), weight 197 lb (89.359 kg).  Physical Exam Physical Exam:  Deferred  100% of 10 min visit spent on counseling and coordination of care.   Data  Reviewed Labs  Assessment    Uterine fibroids, symptomatic. AUB.  Perimenopausal. Endometrial thickening on ultrasound.  Normal endometrial biopsy.  Treated for 30 days with Provera.     Plan   Repeat ultrasound measurement of endometrial thickness. Will follow clinically. Oxycodone Rx for pain.  Orders Placed This Encounter  Procedures  . HIV antibody  . Hepatitis B surface antigen  . RPR  . Hepatitis C antibody   Meds ordered this encounter  Medications  . Oxycodone HCl 10 MG TABS    Sig: Take 1 tablet (10 mg total) by mouth every 6 (six) hours as needed.    Dispense:  40 tablet    Refill:  0        Dayshaun Whobrey A 09/23/2013, 5:57 AM

## 2013-10-06 ENCOUNTER — Ambulatory Visit (INDEPENDENT_AMBULATORY_CARE_PROVIDER_SITE_OTHER): Payer: No Typology Code available for payment source | Admitting: Internal Medicine

## 2013-10-06 ENCOUNTER — Encounter: Payer: Self-pay | Admitting: Internal Medicine

## 2013-10-06 VITALS — BP 140/88 | HR 77 | Temp 98.2°F | Resp 16 | Ht 65.0 in | Wt 198.0 lb

## 2013-10-06 DIAGNOSIS — R7303 Prediabetes: Secondary | ICD-10-CM | POA: Insufficient documentation

## 2013-10-06 DIAGNOSIS — R7309 Other abnormal glucose: Secondary | ICD-10-CM

## 2013-10-06 DIAGNOSIS — M79604 Pain in right leg: Secondary | ICD-10-CM

## 2013-10-06 DIAGNOSIS — J309 Allergic rhinitis, unspecified: Secondary | ICD-10-CM | POA: Insufficient documentation

## 2013-10-06 DIAGNOSIS — M545 Low back pain, unspecified: Secondary | ICD-10-CM

## 2013-10-06 DIAGNOSIS — I10 Essential (primary) hypertension: Secondary | ICD-10-CM

## 2013-10-06 DIAGNOSIS — M79605 Pain in left leg: Secondary | ICD-10-CM

## 2013-10-06 DIAGNOSIS — R52 Pain, unspecified: Secondary | ICD-10-CM | POA: Insufficient documentation

## 2013-10-06 DIAGNOSIS — M542 Cervicalgia: Secondary | ICD-10-CM

## 2013-10-06 DIAGNOSIS — E785 Hyperlipidemia, unspecified: Secondary | ICD-10-CM

## 2013-10-06 MED ORDER — OXYCODONE HCL 10 MG PO TABS: 10.0000 mg | ORAL_TABLET | Freq: Four times a day (QID) | ORAL | Status: DC | PRN

## 2013-10-06 MED ORDER — BECLOMETHASONE DIPROPIONATE 80 MCG/ACT NA AERS: 4.0000 | INHALATION_SPRAY | Freq: Every day | NASAL | Status: DC

## 2013-10-06 MED ORDER — OXYCODONE-ACETAMINOPHEN 5-325 MG PO TABS: 1.0000 | ORAL_TABLET | Freq: Two times a day (BID) | ORAL | Status: DC

## 2013-10-06 MED ORDER — CETIRIZINE HCL 10 MG PO TABS: 10.0000 mg | ORAL_TABLET | Freq: Every day | ORAL | Status: DC

## 2013-10-06 NOTE — Progress Notes (Signed)
Pre visit review using our clinic review tool, if applicable. No additional management support is needed unless otherwise documented below in the visit note. 

## 2013-10-06 NOTE — Progress Notes (Signed)
Subjective:    [name], female    DOB: 06-13-1960, 53 y.o.   MRN: [id number]  Hypertension This is a chronic problem. The current episode started more than 1 year ago. The problem has been gradually improving since onset. The problem is controlled. Associated symptoms include neck pain. Pertinent negatives include no anxiety, blurred vision, chest pain, headaches, malaise/fatigue, orthopnea, palpitations, peripheral edema, PND, shortness of breath or sweats. Agents associated with hypertension include estrogens and NSAIDs. Past treatments include ACE inhibitors. The current treatment provides moderate improvement. Compliance problems include exercise and diet.       Review of Systems  Constitutional: Negative.  Negative for fever, chills, malaise/fatigue, diaphoresis, appetite change and fatigue.  HENT: Positive for congestion and postnasal drip. Negative for dental problem, sinus pressure, sneezing, sore throat, tinnitus and trouble swallowing.   Eyes: Negative.  Negative for blurred vision.  Respiratory: Negative.  Negative for apnea, cough, choking, chest tightness, shortness of breath, wheezing and stridor.   Cardiovascular: Negative.  Negative for chest pain, palpitations, orthopnea, leg swelling and PND.  Gastrointestinal: Negative.  Negative for nausea, vomiting, abdominal pain, diarrhea, constipation and blood in stool.  Endocrine: Negative.   Genitourinary: Negative.   Musculoskeletal: Positive for back pain and neck pain. Negative for arthralgias, gait problem, joint swelling, myalgias and neck stiffness.       She has chronic neck and low back pain without radiation, she tells me that dr. Vira Blanco did MRI's of her neck and low back and several steroid injections. The injections did not help but the occasional dose of percocet does help her pain.  Skin: Negative.   Allergic/Immunologic: Negative.   Neurological: Negative.  Negative for dizziness, tremors, syncope,  weakness, light-headedness, numbness and headaches.  Hematological: Negative.  Negative for adenopathy. Does not bruise/bleed easily.  Psychiatric/Behavioral: Negative.        Objective:   Physical Exam  Vitals reviewed. Constitutional: She is oriented to person, place, and time. She appears well-developed and well-nourished. No distress.  HENT:  Head: Normocephalic and atraumatic.  Mouth/Throat: Oropharynx is clear and moist. No oropharyngeal exudate.  Eyes: Conjunctivae are normal. Right eye exhibits no discharge. Left eye exhibits no discharge. No scleral icterus.  Neck: Normal range of motion. Neck supple. No JVD present. No tracheal deviation present. No thyromegaly present.  Cardiovascular: Normal rate, regular rhythm, normal heart sounds and intact distal pulses.  Exam reveals no gallop and no friction rub.   No murmur heard. Pulmonary/Chest: Effort normal and breath sounds normal. No stridor. No respiratory distress. She has no wheezes. She has no rales. She exhibits no tenderness.  Abdominal: Soft. Bowel sounds are normal. She exhibits no distension and no mass. There is no tenderness. There is no rebound and no guarding.  Musculoskeletal: Normal range of motion. She exhibits no edema and no tenderness.       Cervical back: Normal.       Lumbar back: Normal.  Lymphadenopathy:    She has no cervical adenopathy.  Neurological: She is alert and oriented to person, place, and time. She has normal strength. She displays no atrophy, no tremor and normal reflexes. No cranial nerve deficit or sensory deficit. She exhibits normal muscle tone. She displays a negative Romberg sign. She displays no seizure activity. Coordination and gait normal. She displays no Babinski's sign on the right side. She displays no Babinski's sign on the left side.  Reflex Scores:      Tricep reflexes are 1+ on the  right side and 1+ on the left side.      Bicep reflexes are 1+ on the right side and 1+ on the left  side.      Brachioradialis reflexes are 1+ on the right side and 1+ on the left side.      Patellar reflexes are 1+ on the right side and 1+ on the left side.      Achilles reflexes are 1+ on the right side and 1+ on the left side. Neg SLR in BLE  Skin: Skin is warm and dry. No rash noted. She is not diaphoretic. No erythema. No pallor.     Lab Results  Component Value Date   WBC 6.7 11/05/2012   HGB 10.1* 11/05/2012   HCT 33.5* 11/05/2012   GLUCOSE 106* 11/05/2012   ALT 13 11/05/2012   AST 13 11/05/2012   NA 139 11/05/2012   K 4.1 11/05/2012   CL 106 11/05/2012   CREATININE 0.90 11/05/2012   BUN 9 11/05/2012   CO2 25 11/05/2012       Assessment & Plan:

## 2013-10-06 NOTE — Patient Instructions (Signed)
Back Pain, Adult Low back pain is very common. About 1 in 5 people have back pain.The cause of low back pain is rarely dangerous. The pain often gets better over time.About half of people with a sudden onset of back pain feel better in just 2 weeks. About 8 in 10 people feel better by 6 weeks.  CAUSES Some common causes of back pain include:  Strain of the muscles or ligaments supporting the spine.  Wear and tear (degeneration) of the spinal discs.  Arthritis.  Direct injury to the back. DIAGNOSIS Most of the time, the direct cause of low back pain is not known.However, back pain can be treated effectively even when the exact cause of the pain is unknown.Answering your caregiver's questions about your overall health and symptoms is one of the most accurate ways to make sure the cause of your pain is not dangerous. If your caregiver needs more information, he or she may order lab work or imaging tests (X-rays or MRIs).However, even if imaging tests show changes in your back, this usually does not require surgery. HOME CARE INSTRUCTIONS For many people, back pain returns.Since low back pain is rarely dangerous, it is often a condition that people can learn to manageon their own.   Remain active. It is stressful on the back to sit or stand in one place. Do not sit, drive, or stand in one place for more than 30 minutes at a time. Take short walks on level surfaces as soon as pain allows.Try to increase the length of time you walk each day.  Do not stay in bed.Resting more than 1 or 2 days can delay your recovery.  Do not avoid exercise or work.Your body is made to move.It is not dangerous to be active, even though your back may hurt.Your back will likely heal faster if you return to being active before your pain is gone.  Pay attention to your body when you bend and lift. Many people have less discomfortwhen lifting if they bend their knees, keep the load close to their bodies,and  avoid twisting. Often, the most comfortable positions are those that put less stress on your recovering back.  Find a comfortable position to sleep. Use a firm mattress and lie on your side with your knees slightly bent. If you lie on your back, put a pillow under your knees.  Only take over-the-counter or prescription medicines as directed by your caregiver. Over-the-counter medicines to reduce pain and inflammation are often the most helpful.Your caregiver may prescribe muscle relaxant drugs.These medicines help dull your pain so you can more quickly return to your normal activities and healthy exercise.  Put ice on the injured area.  Put ice in a plastic bag.  Place a towel between your skin and the bag.  Leave the ice on for 15-20 minutes, 03-04 times a day for the first 2 to 3 days. After that, ice and heat may be alternated to reduce pain and spasms.  Ask your caregiver about trying back exercises and gentle massage. This may be of some benefit.  Avoid feeling anxious or stressed.Stress increases muscle tension and can worsen back pain.It is important to recognize when you are anxious or stressed and learn ways to manage it.Exercise is a great option. SEEK MEDICAL CARE IF:  You have pain that is not relieved with rest or medicine.  You have pain that does not improve in 1 week.  You have new symptoms.  You are generally not feeling well. SEEK   IMMEDIATE MEDICAL CARE IF:   You have pain that radiates from your back into your legs.  You develop new bowel or bladder control problems.  You have unusual weakness or numbness in your arms or legs.  You develop nausea or vomiting.  You develop abdominal pain.  You feel faint. Document Released: 02/13/2005 Document Revised: 08/15/2011 Document Reviewed: 06/17/2013 [employer] Patient Information 2015 [employer]. This information is not intended to replace advice given to you by your health care provider. Make sure you  discuss any questions you have with your health care provider.  

## 2013-10-08 NOTE — Assessment & Plan Note (Signed)
Will try qnasl and zyrtec for symptom relief

## 2013-10-08 NOTE — Assessment & Plan Note (Signed)
She will cont nsaids, percocet, and flexeril as needed for pain

## 2013-10-08 NOTE — Assessment & Plan Note (Signed)
I will recheck her A1C to see if she has developed DM2  

## 2013-10-08 NOTE — Assessment & Plan Note (Signed)
Her BP is adequately well controlled 

## 2013-10-08 NOTE — Assessment & Plan Note (Signed)
Cont the current meds for pain relief

## 2013-10-29 ENCOUNTER — Telehealth: Payer: Self-pay | Admitting: *Deleted

## 2013-10-29 NOTE — Telephone Encounter (Signed)
Patient states her daughter was diagnosed with an upper respiratory infection last night at the emergency room. Patient states she is having the same symptoms and is requesting a prescription.   Contact patient and advised her to contact her primary care physician or to seek care at urgent care. Patient verbalized understanding.

## 2013-12-02 ENCOUNTER — Other Ambulatory Visit: Payer: Self-pay | Admitting: Obstetrics

## 2013-12-08 ENCOUNTER — Telehealth: Payer: Self-pay | Admitting: *Deleted

## 2013-12-08 NOTE — Telephone Encounter (Signed)
Patient is requesting premarin vaginal cream and estrogen patches. Patient uses [facility]

## 2013-12-10 ENCOUNTER — Other Ambulatory Visit: Payer: Self-pay | Admitting: Obstetrics

## 2013-12-10 DIAGNOSIS — N952 Postmenopausal atrophic vaginitis: Secondary | ICD-10-CM

## 2013-12-10 DIAGNOSIS — N951 Menopausal and female climacteric states: Secondary | ICD-10-CM

## 2013-12-10 MED ORDER — ESTRADIOL 0.1 MG/GM VA CREA: TOPICAL_CREAM | VAGINAL | Status: DC

## 2013-12-10 MED ORDER — ESTRADIOL-LEVONORGESTREL 0.045-0.015 MG/DAY TD PTWK: 1.0000 | MEDICATED_PATCH | TRANSDERMAL | Status: DC

## 2013-12-10 NOTE — Telephone Encounter (Signed)
Rxs. e-scribed.  I called patient.

## 2013-12-14 ENCOUNTER — Ambulatory Visit (INDEPENDENT_AMBULATORY_CARE_PROVIDER_SITE_OTHER): Payer: No Typology Code available for payment source | Admitting: Family Medicine

## 2013-12-14 VITALS — BP 130/82 | HR 90 | Temp 97.9°F | Resp 16 | Ht 66.75 in | Wt 198.0 lb

## 2013-12-14 DIAGNOSIS — N952 Postmenopausal atrophic vaginitis: Secondary | ICD-10-CM

## 2013-12-14 DIAGNOSIS — H8303 Labyrinthitis, bilateral: Secondary | ICD-10-CM

## 2013-12-14 DIAGNOSIS — J019 Acute sinusitis, unspecified: Secondary | ICD-10-CM

## 2013-12-14 DIAGNOSIS — N898 Other specified noninflammatory disorders of vagina: Secondary | ICD-10-CM

## 2013-12-14 DIAGNOSIS — R300 Dysuria: Secondary | ICD-10-CM

## 2013-12-14 LAB — POCT WET PREP WITH KOH
KOH Prep POC: NEGATIVE
Trichomonas, UA: NEGATIVE
WBC WET PREP PER HPF POC: NEGATIVE
Yeast Wet Prep HPF POC: NEGATIVE

## 2013-12-14 LAB — POCT UA - MICROSCOPIC ONLY
BACTERIA, U MICROSCOPIC: NEGATIVE
Casts, Ur, LPF, POC: NEGATIVE
Crystals, Ur, HPF, POC: NEGATIVE
WBC, UR, HPF, POC: NEGATIVE
YEAST UA: NEGATIVE

## 2013-12-14 LAB — POCT URINALYSIS DIPSTICK
Bilirubin, UA: NEGATIVE
GLUCOSE UA: NEGATIVE
Ketones, UA: NEGATIVE
LEUKOCYTES UA: NEGATIVE
NITRITE UA: NEGATIVE
Protein, UA: NEGATIVE
Spec Grav, UA: 1.02
Urobilinogen, UA: 0.2
pH, UA: 6.5

## 2013-12-14 MED ORDER — AZITHROMYCIN 250 MG PO TABS: ORAL_TABLET | ORAL | Status: DC

## 2013-12-14 MED ORDER — MECLIZINE HCL 25 MG PO TABS: 25.0000 mg | ORAL_TABLET | Freq: Three times a day (TID) | ORAL | Status: DC | PRN

## 2013-12-14 MED ORDER — GUAIFENESIN ER 1200 MG PO TB12: 1.0000 | ORAL_TABLET | Freq: Two times a day (BID) | ORAL | Status: DC | PRN

## 2013-12-14 MED ORDER — METRONIDAZOLE 500 MG PO TABS: 500.0000 mg | ORAL_TABLET | Freq: Two times a day (BID) | ORAL | Status: DC

## 2013-12-14 MED ORDER — FLUCONAZOLE 150 MG PO TABS: 150.0000 mg | ORAL_TABLET | Freq: Once | ORAL | Status: DC

## 2013-12-14 MED ORDER — METHYLPREDNISOLONE ACETATE 80 MG/ML IJ SUSP
80.0000 mg | Freq: Once | INTRAMUSCULAR | Status: AC
  Administered 2013-12-14: 80 mg via INTRAMUSCULAR

## 2013-12-14 NOTE — Progress Notes (Signed)
Subjective:    [name], female    DOB: 1960/05/09, 53 y.o.   MRN: [id number] This chart was scribed for Delman Cheadle, MD by Marti Sleigh, Medical Scribe. This patient was seen in Room 8 and the patient's care was started a 9:56 AM.  Chief Complaint  Patient presents with  . Sinus Congestion    X 2 weeks  . Vaginal Itching    X 1 year, with burning    HPI Past Medical History  Diagnosis Date  . Arthritis   . Hypertension   . Depression   . DDD (degenerative disc disease), lumbar    No Known Allergies Current Outpatient Prescriptions on File Prior to Visit  Medication Sig Dispense Refill  . Beclomethasone Dipropionate (QNASL) 80 MCG/ACT AERS Place 4 puffs into the nose daily.  8.7 g  11  . cetirizine (ZYRTEC) 10 MG tablet Take 1 tablet (10 mg total) by mouth daily.  30 tablet  11  . citalopram (CELEXA) 10 MG tablet Take 10 mg by mouth daily.      . cyclobenzaprine (FLEXERIL) 5 MG tablet TAKE 1 TABLET BY MOUTH 3 TIMES A DAY AS NEEDED FOR MUSCLE SPASMS  60 tablet  11  . estradiol (ESTRACE) 0.1 MG/GM vaginal cream Place one gram cream vaginally 2x weekly.  Taper down as tolerated.  42.5 g  2  . estradiol-levonorgestrel (CLIMARA PRO) 0.045-0.015 MG/DAY Place 1 patch onto the skin once a week.  4 patch  12  . FLUoxetine (PROZAC) 20 MG capsule Take 1 capsule (20 mg total) by mouth daily.  30 capsule  11  . ibuprofen (ADVIL,MOTRIN) 800 MG tablet Take 1 tablet (800 mg total) by mouth every 8 (eight) hours as needed.  30 tablet  5  . lisinopril (PRINIVIL,ZESTRIL) 20 MG tablet 20 mg. Take 1/2 tab daily      . oxyCODONE-acetaminophen (PERCOCET/ROXICET) 5-325 MG per tablet Take 1 tablet by mouth 2 (two) times daily. Ok to fill on or after 10/07/13  60 tablet  0  . Prenatal Vit-Fe Fumarate-FA (PNV PRENATAL PLUS MULTIVITAMIN) 27-1 MG TABS Take 1 tablet by mouth daily before breakfast.  30 tablet  11  . valACYclovir (VALTREX) 500 MG tablet Take 1 tablet (500 mg total) by mouth 2  (two) times daily.  30 tablet  1   No current facility-administered medications on file prior to visit.    HPI Comments: Kathryn Parks is a 53 y.o. female who presents to [facility] complaining of constant moderate sinus pressure that started several weeks ago. Pt has been taking Sudafed, zyrtec, BC's, qnasl OTC medications without relief of her symptoms. Pt endorses post nasal drip, cough, sinus HA, and vertigo. Pt states she has been experiencing vertigo and ataxia for the last three days that lasts for roughly 1 minute and then passes. Pt states that she experienced vertigo two years ago and was prescribed medication with relief of her symptoms.   Pt also states she is experiencing tingling, burning and itching in her vagina for years, and states that she thinks that these symptoms are a result of menopause. Pt states she has not taken her hormone replacement therapy in about a year, because she states it did not help her symptoms. Pt denies vaginal discharge. Pt states she has some urinary incontinence. Pt states she has been using vagicil, and aloe vera OTC without relief of her vaginal symptoms. Pt states that she is experiencing pain with sex, as well as bleeding after sex.  Pt states her sleeping has been. Pt states she has be    Review of Systems  Constitutional: Negative for fever and chills.  HENT: Positive for congestion, postnasal drip, rhinorrhea and sore throat. Negative for ear discharge and ear pain.   Respiratory: Positive for cough.   Genitourinary: Positive for dysuria, urgency, vaginal bleeding and vaginal pain.  Neurological: Positive for dizziness.       Objective:   Physical Exam  Nursing note and vitals reviewed. Constitutional: She is oriented to person, place, and time. She appears well-developed and well-nourished.  HENT:  Head: Normocephalic and atraumatic.  Right Ear: Tympanic membrane normal.  Left Ear: Tympanic membrane normal.  Nose: Mucosal edema present.    Mouth/Throat: Oropharynx is clear and moist.  Eyes: Pupils are equal, round, and reactive to light.  Neck: No JVD present.  No supraclavicular adenopathy.  Cardiovascular: Normal rate and regular rhythm.   Pulmonary/Chest: Effort normal and breath sounds normal. No respiratory distress.  Genitourinary:  Slight dryness and hyperkeratosis around follicles on external labia majora, but normal labia minora mucosa normal . Cervical and vaginal vault normal. Moderate amount of yellow mucoid discharge. No CMT or cervical friability. Normal abdominal exam with uterus and adnexa.  Lymphadenopathy:    She has no cervical adenopathy.  Neurological: She is alert and oriented to person, place, and time.  Skin: Skin is warm and dry.  Psychiatric: She has a normal mood and affect. Her behavior is normal.    BP 130/82  Pulse 90  Temp(Src) 97.9 F (36.6 C) (Oral)  Resp 16  Ht 5' 6.75" (1.695 m)  Wt 198 lb (89.812 kg)  BMI 31.26 kg/m2  SpO2 98%      Assessment & Plan:   Vaginal irritation - Plan: POCT urinalysis dipstick, POCT UA - Microscopic Only, POCT Wet Prep with KOH, GC/Chlamydia Probe Amp, Urine culture - has failed treatment with vaginal estrogen and HRT patches - counseled to f/u w/ gyn as to safety of restarting empiric systemic HRT when off x 1 yr and no sig benefit perceived.  Has failed yeast treatment - therefore will try empiric treatment with flagyl and azithro for vaginitic/cervicitis/urethritis. If this is unsuccessful, may want to consider re-trial of vaginal estrogen at higher dose.  Dysuria - Plan: POCT urinalysis dipstick, POCT UA - Microscopic Only, POCT Wet Prep with KOH, GC/Chlamydia Probe Amp, Urine culture  Postmenopausal atrophic vaginitis - Plan: POCT Wet Prep with KOH, GC/Chlamydia Probe Amp  Acute sinusitis, recurrence not specified, unspecified location - Plan: methylPREDNISolone acetate (DEPO-MEDROL) injection 80 mg - suspect viral but will cover with azithro since  will also serve as empiric treatment for cervicitis.  Labyrinthitis, bilateral - suspect viral - rtc if sxs cont past 7-10d.  Meds ordered this encounter  Medications  . metroNIDAZOLE (FLAGYL) 500 MG tablet    Sig: Take 1 tablet (500 mg total) by mouth 2 (two) times daily.    Dispense:  14 tablet    Refill:  0  . azithromycin (ZITHROMAX) 250 MG tablet    Sig: Take 2 tabs PO x 1 dose, then 1 tab PO QD x 4 days    Dispense:  6 tablet    Refill:  0  . fluconazole (DIFLUCAN) 150 MG tablet    Sig: Take 1 tablet (150 mg total) by mouth once. After antibiotics are complete    Dispense:  1 tablet    Refill:  0  . methylPREDNISolone acetate (DEPO-MEDROL) injection 80 mg  Sig:   . Guaifenesin (MUCINEX MAXIMUM STRENGTH) 1200 MG TB12    Sig: Take 1 tablet (1,200 mg total) by mouth every 12 (twelve) hours as needed.    Dispense:  14 tablet    Refill:  1  . meclizine (ANTIVERT) 25 MG tablet    Sig: Take 1 tablet (25 mg total) by mouth 3 (three) times daily as needed for dizziness.    Dispense:  30 tablet    Refill:  0    I personally performed the services described in this documentation, which was scribed in my presence. The recorded information has been reviewed and considered, and addended by me as needed.  Delman Cheadle, MD MPH

## 2013-12-14 NOTE — Patient Instructions (Signed)
Hot showers or breathing in steam may help loosen the congestion.  Using a netti pot or sinus rinse is also likely to help you feel better and keep this from progressing.  Use the Qnasl nasal spray every night before bed for at least 2 weeks.  I recommend augmenting with 12 hr sudafed (behind the counter) and generic mucinex to help you move out the congestion.  If no improvement or you are getting worse, come back as you might need a course of steroids but hopefully with all of the above, you can avoid it.  Sinusitis Sinusitis is redness, soreness, and inflammation of the paranasal sinuses. Paranasal sinuses are air pockets within the bones of your face (beneath the eyes, the middle of the forehead, or above the eyes). In healthy paranasal sinuses, mucus is able to drain out, and air is able to circulate through them by way of your nose. However, when your paranasal sinuses are inflamed, mucus and air can become trapped. This can allow bacteria and other germs to grow and cause infection. Sinusitis can develop quickly and last only a short time (acute) or continue over a long period (chronic). Sinusitis that lasts for more than 12 weeks is considered chronic.  CAUSES  Causes of sinusitis include:  Allergies.  Structural abnormalities, such as displacement of the cartilage that separates your nostrils (deviated septum), which can decrease the air flow through your nose and sinuses and affect sinus drainage.  Functional abnormalities, such as when the small hairs (cilia) that line your sinuses and help remove mucus do not work properly or are not present. SIGNS AND SYMPTOMS  Symptoms of acute and chronic sinusitis are the same. The primary symptoms are pain and pressure around the affected sinuses. Other symptoms include:  Upper toothache.  Earache.  Headache.  Bad breath.  Decreased sense of smell and taste.  A cough, which worsens when you are lying flat.  Fatigue.  Fever.  Thick  drainage from your nose, which often is green and may contain pus (purulent).  Swelling and warmth over the affected sinuses. DIAGNOSIS  Your health care provider will perform a physical exam. During the exam, your health care provider may:  Look in your nose for signs of abnormal growths in your nostrils (nasal polyps).  Tap over the affected sinus to check for signs of infection.  View the inside of your sinuses (endoscopy) using an imaging device that has a light attached (endoscope). If your health care provider suspects that you have chronic sinusitis, one or more of the following tests may be recommended:  Allergy tests.  Nasal culture. A sample of mucus is taken from your nose, sent to a lab, and screened for bacteria.  Nasal cytology. A sample of mucus is taken from your nose and examined by your health care provider to determine if your sinusitis is related to an allergy. TREATMENT  Most cases of acute sinusitis are related to a viral infection and will resolve on their own within 10 days. Sometimes medicines are prescribed to help relieve symptoms (pain medicine, decongestants, nasal steroid sprays, or saline sprays).  However, for sinusitis related to a bacterial infection, your health care provider will prescribe antibiotic medicines. These are medicines that will help kill the bacteria causing the infection.  Rarely, sinusitis is caused by a fungal infection. In theses cases, your health care provider will prescribe antifungal medicine. For some cases of chronic sinusitis, surgery is needed. Generally, these are cases in which sinusitis recurs more  than 3 times per year, despite other treatments. HOME CARE INSTRUCTIONS   Drink plenty of water. Water helps thin the mucus so your sinuses can drain more easily.  Use a humidifier.  Inhale steam 3 to 4 times a day (for example, sit in the bathroom with the shower running).  Apply a warm, moist washcloth to your face 3 to 4  times a day, or as directed by your health care provider.  Use saline nasal sprays to help moisten and clean your sinuses.  Take medicines only as directed by your health care provider.  If you were prescribed either an antibiotic or antifungal medicine, finish it all even if you start to feel better. SEEK IMMEDIATE MEDICAL CARE IF:  You have increasing pain or severe headaches.  You have nausea, vomiting, or drowsiness.  You have swelling around your face.  You have vision problems.  You have a stiff neck.  You have difficulty breathing. MAKE SURE YOU:   Understand these instructions.  Will watch your condition.  Will get help right away if you are not doing well or get worse. Document Released: 02/13/2005 Document Revised: 06/30/2013 Document Reviewed: 02/28/2011 [facility] Patient Information 2015 [place], [place]. This information is not intended to replace advice given to you by your health care provider. Make sure you discuss any questions you have with your health care provider. Vaginitis Vaginitis is an inflammation of the vagina. It is most often caused by a change in the normal balance of the bacteria and yeast that live in the vagina. This change in balance causes an overgrowth of certain bacteria or yeast, which causes the inflammation. There are different types of vaginitis, but the most common types are:  Bacterial vaginosis.  Yeast infection (candidiasis).  Trichomoniasis vaginitis. This is a sexually transmitted infection (STI).  Viral vaginitis.  Atropic vaginitis.  Allergic vaginitis. CAUSES  The cause depends on the type of vaginitis. Vaginitis can be caused by:  Bacteria (bacterial vaginosis).  Yeast (yeast infection).  A parasite (trichomoniasis vaginitis)  A virus (viral vaginitis).  Low hormone levels (atrophic vaginitis). Low hormone levels can occur during pregnancy, breastfeeding, or after menopause.  Irritants, such as bubble baths,  scented tampons, and feminine sprays (allergic vaginitis). Other factors can change the normal balance of the yeast and bacteria that live in the vagina. These include:  Antibiotic medicines.  Poor hygiene.  Diaphragms, vaginal sponges, spermicides, birth control pills, and intrauterine devices (IUD).  Sexual intercourse.  Infection.  Uncontrolled diabetes.  A weakened immune system. SYMPTOMS  Symptoms can vary depending on the cause of the vaginitis. Common symptoms include:  Abnormal vaginal discharge.  The discharge is white, gray, or yellow with bacterial vaginosis.  The discharge is thick, white, and cheesy with a yeast infection.  The discharge is frothy and yellow or greenish with trichomoniasis.  A bad vaginal odor.  The odor is fishy with bacterial vaginosis.  Vaginal itching, pain, or swelling.  Painful intercourse.  Pain or burning when urinating. Sometimes, there are no symptoms. TREATMENT  Treatment will vary depending on the type of infection.   Bacterial vaginosis and trichomoniasis are often treated with antibiotic creams or pills.  Yeast infections are often treated with antifungal medicines, such as vaginal creams or suppositories.  Viral vaginitis has no cure, but symptoms can be treated with medicines that relieve discomfort. Your sexual partner should be treated as well.  Atrophic vaginitis may be treated with an estrogen cream, pill, suppository, or vaginal ring. If vaginal  dryness occurs, lubricants and moisturizing creams may help. You may be told to avoid scented soaps, sprays, or douches.  Allergic vaginitis treatment involves quitting the use of the product that is causing the problem. Vaginal creams can be used to treat the symptoms. HOME CARE INSTRUCTIONS   Take all medicines as directed by your caregiver.  Keep your genital area clean and dry. Avoid soap and only rinse the area with water.  Avoid douching. It can remove the healthy  bacteria in the vagina.  Do not use tampons or have sexual intercourse until your vaginitis has been treated. Use sanitary pads while you have vaginitis.  Wipe from front to back. This avoids the spread of bacteria from the rectum to the vagina.  Let air reach your genital area.  Wear cotton underwear to decrease moisture buildup.  Avoid wearing underwear while you sleep until your vaginitis is gone.  Avoid tight pants and underwear or nylons without a cotton panel.  Take off wet clothing (especially bathing suits) as soon as possible.  Use mild, non-scented products. Avoid using irritants, such as:  Scented feminine sprays.  Fabric softeners.  Scented detergents.  Scented tampons.  Scented soaps or bubble baths.  Practice safe sex and use condoms. Condoms may prevent the spread of trichomoniasis and viral vaginitis. SEEK MEDICAL CARE IF:   You have abdominal pain.  You have a fever or persistent symptoms for more than 2-3 days.  You have a fever and your symptoms suddenly get worse. Document Released: 12/11/2006 Document Revised: 11/08/2011 Document Reviewed: 07/27/2011 [facility] Patient Information 2015 [place], [place]. This information is not intended to replace advice given to you by your health care provider. Make sure you discuss any questions you have with your health care provider.

## 2013-12-15 ENCOUNTER — Encounter: Payer: Self-pay | Admitting: Family Medicine

## 2013-12-15 LAB — GC/CHLAMYDIA PROBE AMP
CT Probe RNA: NEGATIVE
GC Probe RNA: NEGATIVE

## 2013-12-16 LAB — URINE CULTURE

## 2013-12-30 ENCOUNTER — Other Ambulatory Visit: Payer: Self-pay | Admitting: Obstetrics

## 2014-02-17 ENCOUNTER — Ambulatory Visit: Payer: No Typology Code available for payment source | Admitting: Obstetrics

## 2014-02-24 ENCOUNTER — Other Ambulatory Visit: Payer: Self-pay | Admitting: Obstetrics

## 2014-02-24 DIAGNOSIS — N959 Unspecified menopausal and perimenopausal disorder: Secondary | ICD-10-CM

## 2014-02-24 MED ORDER — ESTROGENS CONJUGATED 1.25 MG PO TABS: 1.2500 mg | ORAL_TABLET | Freq: Every day | ORAL | Status: DC

## 2014-02-24 MED ORDER — PROGESTERONE MICRONIZED 100 MG PO CAPS: 100.0000 mg | ORAL_CAPSULE | Freq: Every day | ORAL | Status: DC

## 2014-02-24 NOTE — Progress Notes (Signed)
Patient called regarding worsening hot flashes and vaginal dryness.  Has been on the Climara Pro patch now for many months with no improvement in menopausal symptoms.  She is requesting a pill.  A/P:  Postmenopausal symptoms of hot flashes and vaginal dryness.  Symptoms unresponsive to Climara Pro patch.  Premarin / Prometrium Rx.  Patient informed that this therapy is not 1st line and is to be used only for vasomotor symptoms, vaginal dryness and bone health; and for the shortest period of time and lowest dose  necessary.   Baltazar Najjar MD

## 2014-02-25 ENCOUNTER — Other Ambulatory Visit: Payer: Self-pay | Admitting: Obstetrics

## 2014-05-04 ENCOUNTER — Ambulatory Visit: Payer: No Typology Code available for payment source | Admitting: Certified Nurse Midwife

## 2014-05-05 ENCOUNTER — Ambulatory Visit (INDEPENDENT_AMBULATORY_CARE_PROVIDER_SITE_OTHER): Payer: 59 | Admitting: Certified Nurse Midwife

## 2014-05-05 ENCOUNTER — Encounter: Payer: Self-pay | Admitting: Certified Nurse Midwife

## 2014-05-05 VITALS — BP 160/94 | HR 74 | Temp 98.2°F | Wt 201.0 lb

## 2014-05-05 DIAGNOSIS — G4730 Sleep apnea, unspecified: Secondary | ICD-10-CM

## 2014-05-05 DIAGNOSIS — I10 Essential (primary) hypertension: Secondary | ICD-10-CM

## 2014-05-05 DIAGNOSIS — Z01419 Encounter for gynecological examination (general) (routine) without abnormal findings: Secondary | ICD-10-CM | POA: Diagnosis not present

## 2014-05-05 DIAGNOSIS — N952 Postmenopausal atrophic vaginitis: Secondary | ICD-10-CM

## 2014-05-05 DIAGNOSIS — F419 Anxiety disorder, unspecified: Secondary | ICD-10-CM

## 2014-05-05 DIAGNOSIS — G8929 Other chronic pain: Secondary | ICD-10-CM

## 2014-05-05 DIAGNOSIS — E669 Obesity, unspecified: Secondary | ICD-10-CM

## 2014-05-05 DIAGNOSIS — R399 Unspecified symptoms and signs involving the genitourinary system: Secondary | ICD-10-CM

## 2014-05-05 DIAGNOSIS — M79641 Pain in right hand: Secondary | ICD-10-CM

## 2014-05-05 DIAGNOSIS — N951 Menopausal and female climacteric states: Secondary | ICD-10-CM

## 2014-05-05 LAB — POCT URINALYSIS DIPSTICK
Bilirubin, UA: NEGATIVE
Blood, UA: 250
Glucose, UA: NEGATIVE
Ketones, UA: NEGATIVE
Leukocytes, UA: NEGATIVE
Nitrite, UA: NEGATIVE
PH UA: 6
Spec Grav, UA: 1.02
Urobilinogen, UA: NEGATIVE

## 2014-05-05 MED ORDER — LISINOPRIL 20 MG PO TABS: 20.0000 mg | ORAL_TABLET | Freq: Every day | ORAL | Status: DC

## 2014-05-05 MED ORDER — FLUOXETINE HCL 20 MG PO CAPS: 20.0000 mg | ORAL_CAPSULE | Freq: Every day | ORAL | Status: DC

## 2014-05-05 MED ORDER — CITALOPRAM HYDROBROMIDE 10 MG PO TABS: 10.0000 mg | ORAL_TABLET | Freq: Every day | ORAL | Status: DC

## 2014-05-05 MED ORDER — MELOXICAM 15 MG PO TABS: 15.0000 mg | ORAL_TABLET | Freq: Every day | ORAL | Status: DC

## 2014-05-05 MED ORDER — CYCLOBENZAPRINE HCL 5 MG PO TABS: 5.0000 mg | ORAL_TABLET | Freq: Three times a day (TID) | ORAL | Status: DC | PRN

## 2014-05-05 NOTE — Addendum Note (Signed)
Addended by: Bettye Boeck on: 05/05/2014 04:21 PM   Modules accepted: Orders

## 2014-05-05 NOTE — Progress Notes (Signed)
[name], female   DOB: [birth year], 54 y.o.   MRN: [id number]   Subjective:     Kathryn Parks is a 54 y.o. female here for a routine exam.  Current complaints:  Right hand pain, ran out of HTN medications, desires refills for her medications: lisinopril, celexa, flexaril.   C/O burning/irritation along vaginal wall not associated with urination.  Does occur with sexual intercourse.  Is currently sexually active, spouse is deceased X 7 years.  One living child.  Desires weight loss, encouraged healthy eating choices/exercise.   Does have irregular spotting with spotting occuring about every three months for the last 3 years, spotting lasts about a day. No heavy bleeding with menses.  Had endometrial biopsy in Jan. 2015 that was negative.  Hot flashes are tolerable.  Desires ENT referral for snoring.   Personal health questionnaire:  Is patient Ashkenazi Jewish, have a family history of breast and/or ovarian cancer: no Is there a family history of uterine cancer diagnosed at age < 81, gastrointestinal cancer, urinary tract cancer, family member who is a Field seismologist syndrome-associated carrier: no Is the patient overweight and hypertensive, family history of diabetes, personal history of gestational diabetes, preeclampsia or PCOS: no Is patient over 75, have PCOS,  family history of premature CHD under age 63, diabetes, smoke, have hypertension or peripheral artery disease:  yes At any time, has a partner hit, kicked or otherwise hurt or frightened you?: no Over the past 2 weeks, have you felt down, depressed or hopeless?: no Over the past 2 weeks, have you felt little interest or pleasure in doing things?:no   Gynecologic History No LMP recorded. [name]. Contraception: condoms Last Pap: 04/28/13. Results were: normal Last mammogram: 05/30/12. Results were: normal  Obstetric History OB History  Gravida Para Term Preterm AB SAB TAB  Ectopic Multiple Living  1 1 1  0 0 0 0 0 0 1    # Outcome Date GA Lbr Len/2nd Weight Sex Delivery Anes PTL Lv  1 Term 02/03/95 [redacted]w[redacted]d  2.977 kg (6 lb 9 oz) F Vag-Spont EPI  Y      Past Medical History  Diagnosis Date  . Arthritis   . Hypertension   . Depression   . DDD (degenerative disc disease), lumbar     Past Surgical History  Procedure Laterality Date  . Tubal ligation  1997     Current outpatient prescriptions:  .  Beclomethasone Dipropionate (QNASL) 80 MCG/ACT AERS, Place 4 puffs into the nose daily., Disp: 8.7 g, Rfl: 11 .  cetirizine (ZYRTEC) 10 MG tablet, Take 1 tablet (10 mg total) by mouth daily., Disp: 30 tablet, Rfl: 11 .  citalopram (CELEXA) 10 MG tablet, Take 1 tablet (10 mg total) by mouth daily., Disp: 30 tablet, Rfl: 12 .  cyclobenzaprine (FLEXERIL) 5 MG tablet, Take 1 tablet (5 mg total) by mouth 3 (three) times daily as needed for muscle spasms., Disp: 60 tablet, Rfl: 11 .  estradiol (ESTRACE) 0.1 MG/GM vaginal cream, Place one gram cream vaginally 2x weekly.  Taper down as tolerated., Disp: 42.5 g, Rfl: 2 .  estrogens, conjugated, (PREMARIN) 1.25 MG tablet, Take 1 tablet (1.25 mg total) by mouth daily., Disp: 30 tablet, Rfl: 5 .  FLUoxetine (PROZAC) 20 MG capsule, Take 1 capsule (20 mg total) by mouth daily., Disp: 30 capsule, Rfl: 11 .  Guaifenesin (MUCINEX MAXIMUM STRENGTH) 1200 MG TB12, Take 1 tablet (1,200 mg total) by mouth every 12 (twelve)  hours as needed., Disp: 14 tablet, Rfl: 1 .  ibuprofen (ADVIL,MOTRIN) 800 MG tablet, Take 1 tablet (800 mg total) by mouth every 8 (eight) hours as needed., Disp: 30 tablet, Rfl: 5 .  lisinopril (PRINIVIL,ZESTRIL) 20 MG tablet, Take 1 tablet (20 mg total) by mouth daily. Take 1/2 tab daily, Disp: 30 tablet, Rfl: 12 .  meclizine (ANTIVERT) 25 MG tablet, Take 1 tablet (25 mg total) by mouth 3 (three) times daily as needed for dizziness., Disp: 30 tablet, Rfl: 0 .  metroNIDAZOLE (FLAGYL) 500 MG tablet, Take 1 tablet (500 mg  total) by mouth 2 (two) times daily., Disp: 14 tablet, Rfl: 0 .  oxyCODONE-acetaminophen (PERCOCET/ROXICET) 5-325 MG per tablet, Take 1 tablet by mouth 2 (two) times daily. Ok to fill on or after 10/07/13, Disp: 60 tablet, Rfl: 0 .  Prenatal Vit-Fe Fumarate-FA (PNV PRENATAL PLUS MULTIVITAMIN) 27-1 MG TABS, Take 1 tablet by mouth daily before breakfast., Disp: 30 tablet, Rfl: 11 .  progesterone (PROMETRIUM) 100 MG capsule, Take 1 capsule (100 mg total) by mouth at bedtime., Disp: 30 capsule, Rfl: 11 .  valACYclovir (VALTREX) 500 MG tablet, Take 1 tablet (500 mg total) by mouth 2 (two) times daily., Disp: 30 tablet, Rfl: 1 .  estradiol-levonorgestrel (CLIMARA PRO) 0.045-0.015 MG/DAY, Place 1 patch onto the skin once a week. (Patient not taking: Reported on 05/05/2014), Disp: 4 patch, Rfl: 12 .  meloxicam (MOBIC) 15 MG tablet, Take 1 tablet (15 mg total) by mouth daily., Disp: 30 tablet, Rfl: 2 .  valACYclovir (VALTREX) 500 MG tablet, TAKE 1 TABLET DAILY (Patient not taking: Reported on 05/05/2014), Disp: 30 tablet, Rfl: 4 No Known Allergies  History  Substance Use Topics  . Smoking status: Never Smoker   . Smokeless tobacco: Never Used  . Alcohol Use: 0.0 oz/week    0 Standard drinks or equivalent per week     Comment: Ocassionally    Family History  Problem Relation Age of Onset  . Heart disease Father   . Hypertension Father   . Diabetes Father   . Birth defects Neg Hx   . Cancer Neg Hx   . Alcohol abuse Neg Hx   . Drug abuse Neg Hx   . Early death Neg Hx   . Hyperlipidemia Neg Hx   . Kidney disease Neg Hx   . Stroke Neg Hx   . Colon cancer Neg Hx   . Diabetes Mother       Review of Systems  Constitutional: negative for fatigue and weight loss Respiratory: negative for cough and wheezing Cardiovascular: negative for chest pain, fatigue and palpitations Gastrointestinal: negative for abdominal pain and change in bowel habits Musculoskeletal:+ for myalgias, r/t nerve damage.    Neurological: negative for gait problems and tremors Behavioral/Psych: negative for abusive relationship, depression Endocrine: negative for temperature intolerance   Genitourinary:negative for genital lesions. Does have irregular bleeding about every 3 mo., hot flashes, sexual problems and vaginal discharge Integument/breast: negative for breast lump, breast tenderness, nipple discharge and skin lesion(s)    Objective:       BP 160/94 mmHg  Pulse 74  Temp(Src) 98.2 F (36.8 C)  Wt 91.173 kg (201 lb) General:   alert  Skin:   no rash or abnormalities  Lungs:   clear to auscultation bilaterally  Heart:   regular rate and rhythm, S1, S2 normal, no murmur, click, rub or gallop  Breasts:   normal without suspicious masses, skin or nipple changes or axillary nodes  Abdomen:  normal findings: no organomegaly, soft, non-tender and no hernia  Pelvis:  External genitalia: normal general appearance Urinary system: urethral meatus normal and bladder without fullness, nontender Vaginal: normal without tenderness, induration or masses Cervix: normal appearance Adnexa: normal bimanual exam Uterus: anteverted and non-tender, normal size   Lab Review Urine pregnancy test Labs reviewed yes Radiologic studies reviewed yes   Assessment:    Healthy female exam.   Hypertension Obesity Chronic pain syndrome r/t nerve damage AUB r/t perimenopausal status Perimenopausal symptoms   Plan:    Education reviewed: depression evaluation, low fat, low cholesterol diet, safe sex/STD prevention, self breast exams, skin cancer screening and weight bearing exercise. Hormone replacement therapy: prescription for 12 months. Mammogram ordered. Follow up in: 1 year.   Meds ordered this encounter  Medications  . citalopram (CELEXA) 10 MG tablet    Sig: Take 1 tablet (10 mg total) by mouth daily.    Dispense:  30 tablet    Refill:  12  . FLUoxetine (PROZAC) 20 MG capsule    Sig: Take 1 capsule (20  mg total) by mouth daily.    Dispense:  30 capsule    Refill:  11  . lisinopril (PRINIVIL,ZESTRIL) 20 MG tablet    Sig: Take 1 tablet (20 mg total) by mouth daily. Take 1/2 tab daily    Dispense:  30 tablet    Refill:  12  . cyclobenzaprine (FLEXERIL) 5 MG tablet    Sig: Take 1 tablet (5 mg total) by mouth 3 (three) times daily as needed for muscle spasms.    Dispense:  60 tablet    Refill:  11  . meloxicam (MOBIC) 15 MG tablet    Sig: Take 1 tablet (15 mg total) by mouth daily.    Dispense:  30 tablet    Refill:  2   Orders Placed This Encounter  Procedures  . HIV antibody (with reflex)  . Hepatitis B surface antigen  . RPR  . Hepatitis C antibody  . Ambulatory referral to Nutrition and Diabetic Education    Referral Priority:  Routine    Referral Type:  Consultation    Referral Reason:  Specialty Services Required    Number of Visits Requested:  1  . Ambulatory referral to ENT    Referral Priority:  Routine    Referral Type:  Consultation    Referral Reason:  Specialty Services Required    Requested Specialty:  Otolaryngology    Number of Visits Requested:  1  . POCT urinalysis dipstick

## 2014-05-06 LAB — RPR

## 2014-05-06 LAB — HEPATITIS B SURFACE ANTIGEN: Hepatitis B Surface Ag: NEGATIVE

## 2014-05-06 LAB — HEPATITIS C ANTIBODY: HCV Ab: NEGATIVE

## 2014-05-06 LAB — HIV ANTIBODY (ROUTINE TESTING W REFLEX): HIV 1&2 Ab, 4th Generation: NONREACTIVE

## 2014-05-07 LAB — PAP IG AND HPV HIGH-RISK: HPV DNA HIGH RISK: NOT DETECTED

## 2014-05-08 ENCOUNTER — Telehealth: Payer: Self-pay

## 2014-05-08 ENCOUNTER — Telehealth: Payer: Self-pay | Admitting: *Deleted

## 2014-05-08 LAB — SURESWAB, VAGINOSIS/VAGINITIS PLUS
Atopobium vaginae: NOT DETECTED Log (cells/mL)
C. ALBICANS, DNA: NOT DETECTED
C. TRACHOMATIS RNA, TMA: NOT DETECTED
C. TROPICALIS, DNA: NOT DETECTED
C. glabrata, DNA: NOT DETECTED
C. parapsilosis, DNA: NOT DETECTED
Gardnerella vaginalis: NOT DETECTED Log (cells/mL)
LACTOBACILLUS SPECIES: 7 Log (cells/mL)
MEGASPHAERA SPECIES: NOT DETECTED Log (cells/mL)
N. gonorrhoeae RNA, TMA: NOT DETECTED
T. VAGINALIS RNA, QL TMA: NOT DETECTED

## 2014-05-08 NOTE — Telephone Encounter (Signed)
PATIENT HAS [employer] [employer], SHE NEEDS A PCP - ONE LISTED IN SYSTEM NO LONGER EXISTS - WILL NEED TO CALL HER INSURANCE CO AND GET ANOTHER ONE

## 2014-05-08 NOTE — Telephone Encounter (Signed)
Pt called to office for lab results.  Return call to pt.  Pt made aware that all labs were WNL.

## 2014-05-11 ENCOUNTER — Telehealth: Payer: Self-pay

## 2014-05-12 NOTE — Telephone Encounter (Signed)
Patient has [facility] [employer] - needs PCP to refer

## 2014-06-03 ENCOUNTER — Ambulatory Visit (INDEPENDENT_AMBULATORY_CARE_PROVIDER_SITE_OTHER): Payer: 59 | Admitting: Sports Medicine

## 2014-06-03 VITALS — BP 140/84 | HR 75 | Temp 98.2°F | Resp 16 | Ht 65.5 in | Wt 198.4 lb

## 2014-06-03 DIAGNOSIS — M5010 Cervical disc disorder with radiculopathy, unspecified cervical region: Secondary | ICD-10-CM | POA: Diagnosis not present

## 2014-06-03 MED ORDER — PREDNISONE 10 MG PO TABS: ORAL_TABLET | ORAL | Status: DC

## 2014-06-03 NOTE — Progress Notes (Signed)
Kathryn Parks - 54 y.o. female MRN [id number]  Date of birth: [birth year]  SUBJECTIVE:  Including CC & ROS.  Patient is a 54 year old African-American female presenting today complaining of right upper extremity weakness, chronic numbness and tingling, and chronic neck pain. Patient has known cervical degenerative disc disease and was previously being seen by Dr. Ronnald Ramp. Cervical x-rays at that time showed moderate disc space narrowing at C3-C4, C4-C5, C5-C6, C6-C7. Cervical MRI revealed multifocal factorial moderate to severe nuclear foraminal stenosis at bilateral C4, C5, C6 nerve levels slightly worse on the right. At that point in time patient was not interested in surgery and had received 8 or 9 ESI injections done by pain management throughout that year. Patient reports her last ESI was in May 2014. She reports that these became ineffective. Patient also had a EMG at that time that showed only mild carpal tunnel syndrome the majority of her nerve impingement within her neck I do not have those results to verified.  Today the patient complains of chronic numbness and tingling starting in her neck and radiating down the posterior aspect of her arm into her hand. She's also developed significant weakness in her arm and hand. Denies any specific pain except for some mild pain at the elbow. 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 The symptoms sound been present for over 40 years. She reports that she'll take an intermittent prednisone she's unsure the dose and she'll also take ibuprofen 800 mg intermittently for pain control. She's not used any narcotics and not use any of her tramadol recently.   ROS:  Constitutional:  No fever, chills, or fatigue.  Respiratory:  No shortness of breath, cough, or wheezing Cardiovascular:  No palpitations, chest pain or syncope Gastrointestinal:  No nausea, no abdominal pain Review of systems otherwise negative except for what is stated in  HPI  HISTORY: Past Medical, Surgical, Social, and Family History Reviewed & Updated per EMR. Pertinent Historical Findings include: Essential hypertension Allergic rhinitis Dysmenorrhea Hyperlipidemia  PHYSICAL EXAM:  VS: BP:140/84 mmHg  HR:75bpm  TEMP:98.2 F (36.8 C)(Oral)  RESP:99 %  HT:5' 5.5" (166.4 cm)   WT:198 lb 6 oz (89.982 kg)  BMI:32.6 UPPER BACK EXAM: General: well nourished Skin of UE: warm; dry, no rashes, lesions, ecchymosis or erythema. Vascular: radial pulses 2+ bilaterally Observation: Normal curvature no excessive lordosis or kyphosis or scoliosis.  Shoulders are aligned, tips of scapula are symmetric Palpation: No step off defects throughout the cervical or thoracic spine.  Moderate to severe significant paraspinal muscle tenderness. Range of motion: Significant decreased range of motion in neck extension and right-sided rotation and side bending. Normal flexion and left-sided rotation. Normal shoulder range of motion.  Special tests: Positive on the right - Spurling sign: With radicular symptoms down into the right hand  Motor and sensory: Diffuse motor weakness in the right upper extremity compared to the left in shoulder abduction, elbow flexion extension, wrist flexion extension. Normal finger abduction and abduction.    ASSESSMENT & PLAN:  Impression: Cervical neck pain with history of degenerative disc disease with nerve root impingement causing radicular symptoms of weakness in the right upper extremity.  Recommendations: -Discuss in length the patient that given her long-standing history of cervical neck pain with radicular symptoms in her right upper extremity now with global weakness suspect the majority of her symptoms are coming from the degenerative changes and disc herniation in her neck. -We discussed that based on her MRI results,  her failed response to multiple ESI injections in the cervical spine that surgical consultation would be her next  best option. -Patient was agreeable with consultation with neurosurgery. A referral has been made. No repeat imaging was done today as they will likely obtain imaging needed for further management. -Provided patient a 10 day course of prednisone to calm down inflammation and pain. Patient can restart Motrin after completing course. She has pain medication at home that she can take for breakthrough pain. -Educated patient that if symptoms of weakness persist she might not gain motor function back and she verbalized understanding.  Follow-up at [facility] when necessary

## 2014-06-03 NOTE — Patient Instructions (Signed)
Treatment for cervical degenerative disc disease with spinal stenosis and right-sided radiculopathy:  Prednisone 10 mg to be taken as a taper Take 5 tablets (50 mg) for 2 days, 4 tablets for 2 days, 3 tablets for 2 days, 2 tablets for 2 days, 1 tablet for 2 days  After completing this 10 day course you can start taking Motrin 800 mg as needed  A referral has been made to [facility] neurosurgery for evaluation of your neck.

## 2014-06-05 ENCOUNTER — Other Ambulatory Visit: Payer: Self-pay | Admitting: *Deleted

## 2014-06-05 ENCOUNTER — Telehealth: Payer: Self-pay | Admitting: *Deleted

## 2014-06-05 DIAGNOSIS — N390 Urinary tract infection, site not specified: Secondary | ICD-10-CM

## 2014-06-05 MED ORDER — NITROFURANTOIN MONOHYD MACRO 100 MG PO CAPS: 100.0000 mg | ORAL_CAPSULE | Freq: Two times a day (BID) | ORAL | Status: DC

## 2014-06-05 NOTE — Telephone Encounter (Signed)
Spoke with pt.  See order note.

## 2014-06-05 NOTE — Telephone Encounter (Signed)
Pt called to office for Rx for UTI.    Return call to pt.  No answer, LM on VM to contact office.

## 2014-06-05 NOTE — Progress Notes (Signed)
Pt had called to office for Rx for UTI.   Return call to pt after after hour call received.  Pt has been having painful urination and frequency.  Pt states that she would like Rx for UTI symptoms.  Per protocol, Macrobid was sent to pharmacy.  Pt was advised if symptoms no better within next few days that she will need an appt before future refills.  Pt advised that if she would like she could come to office to send urine for culture.  Pt states understanding.

## 2014-07-07 ENCOUNTER — Telehealth: Payer: Self-pay | Admitting: *Deleted

## 2014-07-07 NOTE — Telephone Encounter (Signed)
Pt placed call to office regarding lab results. Return call to pt, no answer.  LM on VM making pt aware that labs from her last visit were WNL.  Pt advised to contact office if she would like to discuss further.

## 2014-07-17 ENCOUNTER — Telehealth: Payer: Self-pay | Admitting: *Deleted

## 2014-07-17 NOTE — Telephone Encounter (Signed)
Patient contacted the office requesting lab results.  Attempted to contact patient. Phone call was picked up and then hung up.

## 2014-08-19 NOTE — Telephone Encounter (Signed)
Patient has not returned call to the office. Call to be re-filed.

## 2014-09-07 ENCOUNTER — Other Ambulatory Visit: Payer: Self-pay | Admitting: Physician Assistant

## 2014-09-07 ENCOUNTER — Ambulatory Visit (INDEPENDENT_AMBULATORY_CARE_PROVIDER_SITE_OTHER): Payer: 59 | Admitting: Obstetrics

## 2014-09-07 ENCOUNTER — Encounter: Payer: Self-pay | Admitting: Obstetrics

## 2014-09-07 VITALS — BP 127/87 | HR 70 | Temp 97.9°F | Wt 196.0 lb

## 2014-09-07 DIAGNOSIS — Z131 Encounter for screening for diabetes mellitus: Secondary | ICD-10-CM

## 2014-09-07 DIAGNOSIS — N343 Urethral syndrome, unspecified: Secondary | ICD-10-CM

## 2014-09-07 DIAGNOSIS — N951 Menopausal and female climacteric states: Secondary | ICD-10-CM | POA: Diagnosis not present

## 2014-09-07 DIAGNOSIS — R351 Nocturia: Secondary | ICD-10-CM

## 2014-09-07 DIAGNOSIS — R399 Unspecified symptoms and signs involving the genitourinary system: Secondary | ICD-10-CM

## 2014-09-07 DIAGNOSIS — R300 Dysuria: Secondary | ICD-10-CM

## 2014-09-07 LAB — HEMOGLOBIN A1C
Hgb A1c MFr Bld: 6.3 % — ABNORMAL HIGH (ref <5.7)
Mean Plasma Glucose: 134 mg/dL — ABNORMAL HIGH (ref <117)

## 2014-09-07 NOTE — Progress Notes (Signed)
[name], female   DOB: 26-Mar-1960, 54 y.o.   MRN: [id number]  Chief Complaint  Patient presents with  . Urinary Tract Infection    HPI Kathryn Parks is a 54 y.o. female.  C/O urinary frequency, particularly at night.  Also c/o pain and burning with urination.  Has had chronic vaginal yeast infections with itching and irritation deep in vaginal vault.  Fam Hx positive for Diabetes.  Hot flashes getting worse.  Climara Pro did not work well but the combination of Premarin / Prometrium worked well. HPI  Past Medical History  Diagnosis Date  . Arthritis   . Hypertension   . Depression   . DDD (degenerative disc disease), lumbar     Past Surgical History  Procedure Laterality Date  . Tubal ligation  1997    Family History  Problem Relation Age of Onset  . Heart disease Father   . Hypertension Father   . Diabetes Father   . Birth defects Neg Hx   . Cancer Neg Hx   . Alcohol abuse Neg Hx   . Drug abuse Neg Hx   . Early death Neg Hx   . Hyperlipidemia Neg Hx   . Kidney disease Neg Hx   . Stroke Neg Hx   . Colon cancer Neg Hx   . Diabetes Mother     Social History History  Substance Use Topics  . Smoking status: Never Smoker   . Smokeless tobacco: Never Used  . Alcohol Use: 0.0 oz/week    0 Standard drinks or equivalent per week     Comment: Ocassionally    No Known Allergies  Current Outpatient Prescriptions  Medication Sig Dispense Refill  . Beclomethasone Dipropionate (QNASL) 80 MCG/ACT AERS Place 4 puffs into the nose daily. 8.7 g 11  . cetirizine (ZYRTEC) 10 MG tablet Take 1 tablet (10 mg total) by mouth daily. 30 tablet 11  . citalopram (CELEXA) 10 MG tablet Take 1 tablet (10 mg total) by mouth daily. 30 tablet 12  . cyclobenzaprine (FLEXERIL) 5 MG tablet Take 1 tablet (5 mg total) by mouth 3 (three) times daily as needed for muscle spasms. 60 tablet 11  . estradiol (ESTRACE) 0.1 MG/GM vaginal cream Place one gram cream vaginally 2x weekly.   Taper down as tolerated. 42.5 g 2  . estradiol-levonorgestrel (CLIMARA PRO) 0.045-0.015 MG/DAY Place 1 patch onto the skin once a week. 4 patch 12  . estrogens, conjugated, (PREMARIN) 1.25 MG tablet Take 1 tablet (1.25 mg total) by mouth daily. 30 tablet 5  . FLUoxetine (PROZAC) 20 MG capsule Take 1 capsule (20 mg total) by mouth daily. 30 capsule 11  . Guaifenesin (MUCINEX MAXIMUM STRENGTH) 1200 MG TB12 Take 1 tablet (1,200 mg total) by mouth every 12 (twelve) hours as needed. 14 tablet 1  . ibuprofen (ADVIL,MOTRIN) 800 MG tablet Take 1 tablet (800 mg total) by mouth every 8 (eight) hours as needed. 30 tablet 5  . lisinopril (PRINIVIL,ZESTRIL) 20 MG tablet Take 1 tablet (20 mg total) by mouth daily. Take 1/2 tab daily 30 tablet 12  . progesterone (PROMETRIUM) 100 MG capsule Take 1 capsule (100 mg total) by mouth at bedtime. 30 capsule 11  . nitrofurantoin, macrocrystal-monohydrate, (MACROBID) 100 MG capsule Take 1 capsule (100 mg total) by mouth 2 (two) times daily. (Patient not taking: Reported on 09/07/2014) 10 capsule 0  . predniSONE (DELTASONE) 10 MG tablet 5 tablets PO with breakfast, then 4 tabs for 2 days, 3 tabs for 2  days, 2tabs for 2 days, 1 tab for 2 days (Patient not taking: Reported on 09/07/2014) 30 tablet 0   No current facility-administered medications for this visit.    Review of Systems Review of Systems Constitutional: negative for fatigue and weight loss Respiratory: negative for cough and wheezing Cardiovascular: negative for chest pain, fatigue and palpitations Gastrointestinal: negative for abdominal pain and change in bowel habits Genitourinary: positive menopausal vasomotor symptoms, chronic vaginal yeast and dysuria / nocturia Integument/breast: negative for nipple discharge Musculoskeletal:negative for myalgias Neurological: negative for gait problems and tremors Behavioral/Psych: negative for abusive relationship.  Positive for depression / anxiety Endocrine:  negative for temperature intolerance     Blood pressure 127/87, pulse 70, temperature 97.9 F (36.6 C), weight 196 lb (88.905 kg).  Physical Exam Physical Exam General:   alert  Skin:   no rash or abnormalities  Lungs:   clear to auscultation bilaterally  Heart:   regular rate and rhythm, S1, S2 normal, no murmur, click, rub or gallop  Breasts:   normal without suspicious masses, skin or nipple changes or axillary nodes  Abdomen:  normal findings: no organomegaly, soft, non-tender and no hernia  Pelvis:  External genitalia: normal general appearance Urinary system: urethral meatus normal and bladder without fullness, nontender Vaginal: normal without tenderness, induration or masses Cervix: normal appearance Adnexa: normal bimanual exam Uterus: anteverted and non-tender, normal size      Data Reviewed Labs Ultrasound Pathology  Assessment     Perimenopause Dysuria / Nocturia Syndrome Vasomotor symptoms     Plan    Discussed HRT  FSH / LH  Sent Urine culture Sent  HgbA1c sent Referred to Urology - R/O IC. F/U in 2 weeks  Orders Placed This Encounter  Procedures  . HgB A1c  . FSH    Would like FSH / LH ratio  . LH    Would like [place] / LH ratio  . Ambulatory referral to Urology    Referral Priority:  Routine    Referral Type:  Consultation    Referral Reason:  Specialty Services Required    Requested Specialty:  Urology    Number of Visits Requested:  1  . POCT urinalysis dipstick   No orders of the defined types were placed in this encounter.

## 2014-09-08 ENCOUNTER — Ambulatory Visit: Payer: 59 | Admitting: Obstetrics

## 2014-09-08 LAB — URINE CULTURE
Colony Count: NO GROWTH
ORGANISM ID, BACTERIA: NO GROWTH

## 2014-09-08 LAB — LUTEINIZING HORMONE: LH: 23.3 m[IU]/mL

## 2014-09-08 LAB — FOLLICLE STIMULATING HORMONE: FSH: 57.3 m[IU]/mL

## 2014-09-10 LAB — SURESWAB BACTERIAL VAGINOSIS/ITIS
Atopobium vaginae: NOT DETECTED Log (cells/mL)
C. PARAPSILOSIS, DNA: NOT DETECTED
C. albicans, DNA: NOT DETECTED
C. glabrata, DNA: NOT DETECTED
C. tropicalis, DNA: NOT DETECTED
Gardnerella vaginalis: NOT DETECTED Log (cells/mL)
LACTOBACILLUS SPECIES: 7.5 Log (cells/mL)
MEGASPHAERA SPECIES: NOT DETECTED Log (cells/mL)
T. vaginalis RNA, QL TMA: NOT DETECTED

## 2014-09-21 ENCOUNTER — Ambulatory Visit (INDEPENDENT_AMBULATORY_CARE_PROVIDER_SITE_OTHER): Payer: 59 | Admitting: Obstetrics

## 2014-09-21 ENCOUNTER — Encounter: Payer: Self-pay | Admitting: Obstetrics

## 2014-09-21 VITALS — BP 147/91 | HR 74 | Temp 98.1°F | Wt 195.0 lb

## 2014-09-21 DIAGNOSIS — N951 Menopausal and female climacteric states: Secondary | ICD-10-CM

## 2014-09-21 DIAGNOSIS — B9689 Other specified bacterial agents as the cause of diseases classified elsewhere: Secondary | ICD-10-CM

## 2014-09-21 DIAGNOSIS — A6000 Herpesviral infection of urogenital system, unspecified: Secondary | ICD-10-CM

## 2014-09-21 DIAGNOSIS — R399 Unspecified symptoms and signs involving the genitourinary system: Secondary | ICD-10-CM | POA: Diagnosis not present

## 2014-09-21 DIAGNOSIS — A499 Bacterial infection, unspecified: Secondary | ICD-10-CM

## 2014-09-21 DIAGNOSIS — N760 Acute vaginitis: Secondary | ICD-10-CM

## 2014-09-21 DIAGNOSIS — D251 Intramural leiomyoma of uterus: Secondary | ICD-10-CM | POA: Diagnosis not present

## 2014-09-21 MED ORDER — VALACYCLOVIR HCL 1 G PO TABS
ORAL_TABLET | ORAL | Status: DC
Start: 2014-09-21 — End: 2016-05-10

## 2014-09-21 NOTE — Progress Notes (Signed)
[name], female   DOB: May 05, 1960, 54 y.o.   MRN: [id number]  Chief Complaint  Patient presents with  . Follow-up    labs    HPI Kathryn Parks is a 54 y.o. female.  Presents to discuss lab results.  H/O UTI symptoms and menopausal symptoms.  HPI  Past Medical History  Diagnosis Date  . Arthritis   . Hypertension   . Depression   . DDD (degenerative disc disease), lumbar     Past Surgical History  Procedure Laterality Date  . Tubal ligation  1997    Family History  Problem Relation Age of Onset  . Heart disease Father   . Hypertension Father   . Diabetes Father   . Birth defects Neg Hx   . Cancer Neg Hx   . Alcohol abuse Neg Hx   . Drug abuse Neg Hx   . Early death Neg Hx   . Hyperlipidemia Neg Hx   . Kidney disease Neg Hx   . Stroke Neg Hx   . Colon cancer Neg Hx   . Diabetes Mother     Social History History  Substance Use Topics  . Smoking status: Never Smoker   . Smokeless tobacco: Never Used  . Alcohol Use: 0.0 oz/week    0 Standard drinks or equivalent per week     Comment: Ocassionally    No Known Allergies  Current Outpatient Prescriptions  Medication Sig Dispense Refill  . Beclomethasone Dipropionate (QNASL) 80 MCG/ACT AERS Place 4 puffs into the nose daily. 8.7 g 11  . cetirizine (ZYRTEC) 10 MG tablet Take 1 tablet (10 mg total) by mouth daily. 30 tablet 11  . citalopram (CELEXA) 10 MG tablet Take 1 tablet (10 mg total) by mouth daily. 30 tablet 12  . cyclobenzaprine (FLEXERIL) 5 MG tablet Take 1 tablet (5 mg total) by mouth 3 (three) times daily as needed for muscle spasms. 60 tablet 11  . estradiol (ESTRACE) 0.1 MG/GM vaginal cream Place one gram cream vaginally 2x weekly.  Taper down as tolerated. 42.5 g 2  . estradiol-levonorgestrel (CLIMARA PRO) 0.045-0.015 MG/DAY Place 1 patch onto the skin once a week. 4 patch 12  . estrogens, conjugated, (PREMARIN) 1.25 MG tablet Take 1 tablet (1.25 mg total) by mouth daily. 30 tablet 5   . FLUoxetine (PROZAC) 20 MG capsule Take 1 capsule (20 mg total) by mouth daily. 30 capsule 11  . Guaifenesin (MUCINEX MAXIMUM STRENGTH) 1200 MG TB12 Take 1 tablet (1,200 mg total) by mouth every 12 (twelve) hours as needed. 14 tablet 1  . ibuprofen (ADVIL,MOTRIN) 800 MG tablet Take 1 tablet (800 mg total) by mouth every 8 (eight) hours as needed. 30 tablet 5  . lisinopril (PRINIVIL,ZESTRIL) 20 MG tablet Take 1 tablet (20 mg total) by mouth daily. Take 1/2 tab daily 30 tablet 12  . nitrofurantoin, macrocrystal-monohydrate, (MACROBID) 100 MG capsule Take 1 capsule (100 mg total) by mouth 2 (two) times daily. (Patient not taking: Reported on 09/07/2014) 10 capsule 0  . predniSONE (DELTASONE) 10 MG tablet 5 tablets PO with breakfast, then 4 tabs for 2 days, 3 tabs for 2 days, 2tabs for 2 days, 1 tab for 2 days (Patient not taking: Reported on 09/07/2014) 30 tablet 0  . progesterone (PROMETRIUM) 100 MG capsule Take 1 capsule (100 mg total) by mouth at bedtime. 30 capsule 11  . valACYclovir (VALTREX) 1000 MG tablet Take 1 tablet po daily for suppression. 30 tablet 11   No current facility-administered  medications for this visit.    Review of Systems Review of Systems Constitutional: negative for fatigue and weight loss Respiratory: negative for cough and wheezing Cardiovascular: negative for chest pain, fatigue and palpitations Gastrointestinal: negative for abdominal pain and change in bowel habits Genitourinary: dysuria, frequency, nocturia.  Vaginal irritation Integument/breast: negative for nipple discharge Musculoskeletal:negative for myalgias Neurological: negative for gait problems and tremors Behavioral/Psych: negative for abusive relationship, depression Endocrine: positive for hot flashes    Blood pressure 147/91, pulse 74, temperature 98.1 F (36.7 C), weight 195 lb (88.451 kg).  Physical Exam Physical Exam                                                                                             100% of 20 min visit spent on counseling and coordination of care.   Data Reviewed Urine culture - negative CBC HgbA1c - prediabetic range   Assessment     Dysuria.  Urine culture negative.  Has Urology referral. Menopausal symptoms.  No meds at this time. BV. Prediabetic H/O fibroids.    H/O genital herpes with increased frequency of outbreaks.  Plan    Referred to Urology.  R/O OAB vs IC. PCP will follow for diabetic risk Clindamycin cream dispensed for BV Ultrasound ordered to F/U fibroids. Valtrex dose increased to 1 gram daily for suppression   Orders Placed This Encounter  Procedures  . US Transvaginal Non-OB    Standing Status: Future     Number of Occurrences:      Standing Expiration Date: 11/22/2015    Order Specific Question:  Reason for Exam (SYMPTOM  OR DIAGNOSIS REQUIRED)    Answer:  fibroids    Order Specific Question:  Preferred imaging location?    Answer:  [facility]  . US Pelvis Complete    Standing Status: Future     Number of Occurrences:      Standing Expiration Date: 11/22/2015    Order Specific Question:  Reason for Exam (SYMPTOM  OR DIAGNOSIS REQUIRED)    Answer:  fibroids    Order Specific Question:  Preferred imaging location?    Answer:  [facility]   Meds ordered this encounter  Medications  . valACYclovir (VALTREX) 1000 MG tablet    Sig: Take 1 tablet po daily for suppression.    Dispense:  30 tablet    Refill:  11

## 2014-10-05 ENCOUNTER — Ambulatory Visit (HOSPITAL_COMMUNITY)
Admission: RE | Admit: 2014-10-05 | Discharge: 2014-10-05 | Disposition: A | Discharge status: Home / Self Care | Payer: 59 | Source: Ambulatory Visit | Attending: Obstetrics | Admitting: Obstetrics

## 2014-10-05 ENCOUNTER — Ambulatory Visit (INDEPENDENT_AMBULATORY_CARE_PROVIDER_SITE_OTHER): Payer: 59 | Admitting: Obstetrics

## 2014-10-05 ENCOUNTER — Encounter: Payer: Self-pay | Admitting: Obstetrics

## 2014-10-05 VITALS — BP 124/79 | HR 65 | Temp 98.0°F | Ht 65.0 in | Wt 195.0 lb

## 2014-10-05 DIAGNOSIS — Z9851 Tubal ligation status: Secondary | ICD-10-CM | POA: Insufficient documentation

## 2014-10-05 DIAGNOSIS — D259 Leiomyoma of uterus, unspecified: Secondary | ICD-10-CM | POA: Insufficient documentation

## 2014-10-05 DIAGNOSIS — R938 Abnormal findings on diagnostic imaging of other specified body structures: Secondary | ICD-10-CM | POA: Insufficient documentation

## 2014-10-05 DIAGNOSIS — D251 Intramural leiomyoma of uterus: Secondary | ICD-10-CM | POA: Diagnosis not present

## 2014-10-05 DIAGNOSIS — N951 Menopausal and female climacteric states: Secondary | ICD-10-CM

## 2014-10-05 NOTE — Progress Notes (Signed)
[name], female   DOB: 1960/06/26, 54 y.o.   MRN: [id number]  Chief Complaint  Patient presents with  . Follow-up    test results    HPI Kathryn Parks is a 54 y.o. female.  Presents for results of ultrasound.  No complaints.  HPI  Past Medical History  Diagnosis Date  . Arthritis   . Hypertension   . Depression   . DDD (degenerative disc disease), lumbar     Past Surgical History  Procedure Laterality Date  . Tubal ligation  1997    Family History  Problem Relation Age of Onset  . Heart disease Father   . Hypertension Father   . Diabetes Father   . Birth defects Neg Hx   . Cancer Neg Hx   . Alcohol abuse Neg Hx   . Drug abuse Neg Hx   . Early death Neg Hx   . Hyperlipidemia Neg Hx   . Kidney disease Neg Hx   . Stroke Neg Hx   . Colon cancer Neg Hx   . Diabetes Mother     Social History History  Substance Use Topics  . Smoking status: Never Smoker   . Smokeless tobacco: Never Used  . Alcohol Use: 0.0 oz/week    0 Standard drinks or equivalent per week     Comment: Ocassionally    No Known Allergies  Current Outpatient Prescriptions  Medication Sig Dispense Refill  . Beclomethasone Dipropionate (QNASL) 80 MCG/ACT AERS Place 4 puffs into the nose daily. 8.7 g 11  . cetirizine (ZYRTEC) 10 MG tablet Take 1 tablet (10 mg total) by mouth daily. 30 tablet 11  . citalopram (CELEXA) 10 MG tablet Take 1 tablet (10 mg total) by mouth daily. 30 tablet 12  . cyclobenzaprine (FLEXERIL) 5 MG tablet Take 1 tablet (5 mg total) by mouth 3 (three) times daily as needed for muscle spasms. 60 tablet 11  . estradiol (ESTRACE) 0.1 MG/GM vaginal cream Place one gram cream vaginally 2x weekly.  Taper down as tolerated. 42.5 g 2  . estradiol-levonorgestrel (CLIMARA PRO) 0.045-0.015 MG/DAY Place 1 patch onto the skin once a week. 4 patch 12  . estrogens, conjugated, (PREMARIN) 1.25 MG tablet Take 1 tablet (1.25 mg total) by mouth daily. 30 tablet 5  . FLUoxetine  (PROZAC) 20 MG capsule Take 1 capsule (20 mg total) by mouth daily. 30 capsule 11  . Guaifenesin (MUCINEX MAXIMUM STRENGTH) 1200 MG TB12 Take 1 tablet (1,200 mg total) by mouth every 12 (twelve) hours as needed. 14 tablet 1  . ibuprofen (ADVIL,MOTRIN) 800 MG tablet Take 1 tablet (800 mg total) by mouth every 8 (eight) hours as needed. 30 tablet 5  . lisinopril (PRINIVIL,ZESTRIL) 20 MG tablet Take 1 tablet (20 mg total) by mouth daily. Take 1/2 tab daily 30 tablet 12  . progesterone (PROMETRIUM) 100 MG capsule Take 1 capsule (100 mg total) by mouth at bedtime. 30 capsule 11  . nitrofurantoin, macrocrystal-monohydrate, (MACROBID) 100 MG capsule Take 1 capsule (100 mg total) by mouth 2 (two) times daily. (Patient not taking: Reported on 09/07/2014) 10 capsule 0  . predniSONE (DELTASONE) 10 MG tablet 5 tablets PO with breakfast, then 4 tabs for 2 days, 3 tabs for 2 days, 2tabs for 2 days, 1 tab for 2 days (Patient not taking: Reported on 09/07/2014) 30 tablet 0  . valACYclovir (VALTREX) 1000 MG tablet Take 1 tablet po daily for suppression. 30 tablet 11   No current facility-administered medications for this  visit.    Review of Systems Review of Systems Constitutional: negative for fatigue and weight loss Respiratory: negative for cough and wheezing Cardiovascular: negative for chest pain, fatigue and palpitations Gastrointestinal: negative for abdominal pain and change in bowel habits Genitourinary:negative Integument/breast: negative for nipple discharge Musculoskeletal:negative for myalgias Neurological: negative for gait problems and tremors Behavioral/Psych: negative for abusive relationship, depression Endocrine: negative for temperature intolerance     Blood pressure 124/79, pulse 65, temperature 98 F (36.7 C), height 5\' 5"  (1.651 m), weight 195 lb (88.451 kg).  Physical Exam Physical Exam General:   alert  Skin:   no rash or abnormalities  Lungs:   clear to auscultation bilaterally   Heart:   regular rate and rhythm, S1, S2 normal, no murmur, click, rub or gallop  Breasts:   normal without suspicious masses, skin or nipple changes or axillary nodes  Abdomen:  normal findings: no organomegaly, soft, non-tender and no hernia  Pelvis:  External genitalia: normal general appearance Urinary system: urethral meatus normal and bladder without fullness, nontender Vaginal: normal without tenderness, induration or masses Cervix: normal appearance Adnexa: normal bimanual exam Uterus: anteverted and non-tender, normal size    100% of 10 min visit spent on counseling and coordination of care.   Data Reviewed Ultrasound  Assessment     Uterine fibroids.  Stable.     Plan    Will follow clinically with yearly pelvic exams. Patent reassured that there should be no further growth of the fibroids now that she is postmenopausal.  No orders of the defined types were placed in this encounter.   No orders of the defined types were placed in this encounter.

## 2014-10-08 ENCOUNTER — Telehealth: Payer: Self-pay

## 2014-10-08 NOTE — Telephone Encounter (Signed)
Left a message for patient to call.   RE: Mammogram information needed.   Where and when if completed. Please send response to individual that documented this call.    

## 2014-12-14 ENCOUNTER — Other Ambulatory Visit: Payer: Self-pay | Admitting: *Deleted

## 2014-12-14 DIAGNOSIS — I10 Essential (primary) hypertension: Secondary | ICD-10-CM

## 2014-12-14 MED ORDER — LISINOPRIL 20 MG PO TABS: 20.0000 mg | ORAL_TABLET | Freq: Every day | ORAL | Status: DC

## 2015-05-10 ENCOUNTER — Ambulatory Visit (INDEPENDENT_AMBULATORY_CARE_PROVIDER_SITE_OTHER): Payer: BLUE CROSS/BLUE SHIELD | Admitting: Obstetrics

## 2015-05-10 ENCOUNTER — Ambulatory Visit: Payer: No Typology Code available for payment source | Admitting: Obstetrics

## 2015-05-10 ENCOUNTER — Other Ambulatory Visit: Payer: Self-pay | Admitting: Certified Nurse Midwife

## 2015-05-10 ENCOUNTER — Encounter: Payer: Self-pay | Admitting: Obstetrics

## 2015-05-10 VITALS — BP 137/90 | HR 84 | Temp 98.4°F | Wt 204.0 lb

## 2015-05-10 DIAGNOSIS — R301 Vesical tenesmus: Secondary | ICD-10-CM

## 2015-05-10 DIAGNOSIS — Z01419 Encounter for gynecological examination (general) (routine) without abnormal findings: Secondary | ICD-10-CM | POA: Diagnosis not present

## 2015-05-10 DIAGNOSIS — N946 Dysmenorrhea, unspecified: Secondary | ICD-10-CM

## 2015-05-10 DIAGNOSIS — Z1231 Encounter for screening mammogram for malignant neoplasm of breast: Secondary | ICD-10-CM

## 2015-05-10 DIAGNOSIS — I10 Essential (primary) hypertension: Secondary | ICD-10-CM

## 2015-05-10 DIAGNOSIS — R42 Dizziness and giddiness: Secondary | ICD-10-CM

## 2015-05-10 MED ORDER — IBUPROFEN 800 MG PO TABS: 800.0000 mg | ORAL_TABLET | Freq: Three times a day (TID) | ORAL | Status: DC | PRN

## 2015-05-10 MED ORDER — MECLIZINE HCL 25 MG PO TABS
25.0000 mg | ORAL_TABLET | Freq: Three times a day (TID) | ORAL | Status: DC | PRN
Start: 2015-05-10 — End: 2017-07-12

## 2015-05-10 MED ORDER — LISINOPRIL 20 MG PO TABS: 20.0000 mg | ORAL_TABLET | Freq: Every day | ORAL | Status: DC

## 2015-05-11 ENCOUNTER — Encounter: Payer: Self-pay | Admitting: Obstetrics

## 2015-05-11 ENCOUNTER — Telehealth: Payer: Self-pay

## 2015-05-11 LAB — CBC
HEMOGLOBIN: 14.9 g/dL (ref 11.1–15.9)
Hematocrit: 42.7 % (ref 34.0–46.6)
MCH: 30.5 pg (ref 26.6–33.0)
MCHC: 34.9 g/dL (ref 31.5–35.7)
MCV: 87 fL (ref 79–97)
Platelets: 251 10*3/uL (ref 150–379)
RBC: 4.89 x10E6/uL (ref 3.77–5.28)
RDW: 16 % — ABNORMAL HIGH (ref 12.3–15.4)
WBC: 4.5 10*3/uL (ref 3.4–10.8)

## 2015-05-11 LAB — COMPREHENSIVE METABOLIC PANEL
ALT: 23 IU/L (ref 0–32)
AST: 23 IU/L (ref 0–40)
Albumin/Globulin Ratio: 1.6 (ref 1.2–2.2)
Albumin: 4.9 g/dL (ref 3.5–5.5)
Alkaline Phosphatase: 98 IU/L (ref 39–117)
BUN/Creatinine Ratio: 12 (ref 9–23)
BUN: 12 mg/dL (ref 6–24)
Bilirubin Total: 0.3 mg/dL (ref 0.0–1.2)
CO2: 26 mmol/L (ref 18–29)
Calcium: 10 mg/dL (ref 8.7–10.2)
Chloride: 99 mmol/L (ref 96–106)
Creatinine, Ser: 1 mg/dL (ref 0.57–1.00)
GFR calc Af Amer: 73 mL/min/{1.73_m2} (ref >59)
GFR calc non Af Amer: 63 mL/min/{1.73_m2} (ref >59)
GLUCOSE: 97 mg/dL (ref 65–99)
Globulin, Total: 3 g/dL (ref 1.5–4.5)
Potassium: 4.1 mmol/L (ref 3.5–5.2)
Sodium: 142 mmol/L (ref 134–144)
Total Protein: 7.9 g/dL (ref 6.0–8.5)

## 2015-05-11 LAB — HEPATITIS B SURFACE ANTIGEN: Hepatitis B Surface Ag: NEGATIVE

## 2015-05-11 LAB — TSH: TSH: 2.67 u[IU]/mL (ref 0.450–4.500)

## 2015-05-11 LAB — HEPATITIS C ANTIBODY: HEP C VIRUS AB: 0.1 {s_co_ratio} (ref 0.0–0.9)

## 2015-05-11 LAB — HIV ANTIBODY (ROUTINE TESTING W REFLEX): HIV SCREEN 4TH GENERATION: NONREACTIVE

## 2015-05-11 LAB — RPR: RPR Ser Ql: NONREACTIVE

## 2015-05-11 NOTE — Telephone Encounter (Signed)
Left message with patient - sch appt with [facility] for 4/3 and GI for breast exam on 3/23 - and waiting to hear back from [facility]

## 2015-05-11 NOTE — Progress Notes (Signed)
Subjective:        Kathryn Parks is a 55 y.o. female here for a routine exam.  Current complaints: Chronic suprapubic pain and pain with urination.  Also has dizziness with change in position.  Has H/O vertigo.  H/O fibroids with painful periods, stable.  Personal health questionnaire:  Is patient Ashkenazi Jewish, have a family history of breast and/or ovarian cancer: no Is there a family history of uterine cancer diagnosed at age < 37, gastrointestinal cancer, urinary tract cancer, family member who is a Field seismologist syndrome-associated carrier: no Is the patient overweight and hypertensive, family history of diabetes, personal history of gestational diabetes, preeclampsia or PCOS: no Is patient over 92, have PCOS,  family history of premature CHD under age 58, diabetes, smoke, have hypertension or peripheral artery disease:  no At any time, has a partner hit, kicked or otherwise hurt or frightened you?: no Over the past 2 weeks, have you felt down, depressed or hopeless?: no Over the past 2 weeks, have you felt little interest or pleasure in doing things?:no   Gynecologic History No LMP recorded. [name]. Contraception: tubal ligation Last Pap: 2016. Results were: normal Last mammogram: 2016. Results were: normal  Obstetric History OB History  Gravida Para Term Preterm AB SAB TAB Ectopic Multiple Living  1 1 1  0 0 0 0 0 0 1    # Outcome Date GA Lbr Len/2nd Weight Sex Delivery Anes PTL Lv  1 Term 02/03/95 [redacted]w[redacted]d  6 lb 9 oz (2.977 kg) F Vag-Spont EPI  Y      Past Medical History  Diagnosis Date  . Arthritis   . Hypertension   . Depression   . DDD (degenerative disc disease), lumbar     Past Surgical History  Procedure Laterality Date  . Tubal ligation  1997     Current outpatient prescriptions:  .  Beclomethasone Dipropionate (QNASL) 80 MCG/ACT AERS, Place 4 puffs into the nose daily., Disp: 8.7 g, Rfl: 11 .   cetirizine (ZYRTEC) 10 MG tablet, Take 1 tablet (10 mg total) by mouth daily., Disp: 30 tablet, Rfl: 11 .  citalopram (CELEXA) 10 MG tablet, Take 1 tablet (10 mg total) by mouth daily., Disp: 30 tablet, Rfl: 12 .  estradiol (ESTRACE) 0.1 MG/GM vaginal cream, Place one gram cream vaginally 2x weekly.  Taper down as tolerated., Disp: 42.5 g, Rfl: 2 .  estradiol-levonorgestrel (CLIMARA PRO) 0.045-0.015 MG/DAY, Place 1 patch onto the skin once a week., Disp: 4 patch, Rfl: 12 .  estrogens, conjugated, (PREMARIN) 1.25 MG tablet, Take 1 tablet (1.25 mg total) by mouth daily., Disp: 30 tablet, Rfl: 5 .  FLUoxetine (PROZAC) 20 MG capsule, Take 1 capsule (20 mg total) by mouth daily., Disp: 30 capsule, Rfl: 11 .  ibuprofen (ADVIL,MOTRIN) 800 MG tablet, Take 1 tablet (800 mg total) by mouth every 8 (eight) hours as needed., Disp: 30 tablet, Rfl: 5 .  lisinopril (PRINIVIL,ZESTRIL) 20 MG tablet, Take 1 tablet (20 mg total) by mouth daily. Take 1/2 tab daily, Disp: 30 tablet, Rfl: 12 .  progesterone (PROMETRIUM) 100 MG capsule, Take 1 capsule (100 mg total) by mouth at bedtime., Disp: 30 capsule, Rfl: 11 .  valACYclovir (VALTREX) 1000 MG tablet, Take 1 tablet po daily for suppression., Disp: 30 tablet, Rfl: 11 .  meclizine (ANTIVERT) 25 MG tablet, Take 1 tablet (25 mg total) by mouth 3 (three) times daily as needed for dizziness., Disp: 30 tablet, Rfl: 4 No  Known Allergies  Social History  Substance Use Topics  . Smoking status: Never Smoker   . Smokeless tobacco: Never Used  . Alcohol Use: 0.0 oz/week    0 Standard drinks or equivalent per week     Comment: Ocassionally    Family History  Problem Relation Age of Onset  . Heart disease Father   . Hypertension Father   . Diabetes Father   . Birth defects Neg Hx   . Cancer Neg Hx   . Alcohol abuse Neg Hx   . Drug abuse Neg Hx   . Early death Neg Hx   . Hyperlipidemia Neg Hx   . Kidney disease Neg Hx   . Stroke Neg Hx   . Colon cancer Neg Hx   .  Diabetes Mother       Review of Systems  Constitutional: negative for fatigue and weight loss Respiratory: negative for cough and wheezing Cardiovascular: negative for chest pain, fatigue and palpitations Gastrointestinal: negative for abdominal pain and change in bowel habits Musculoskeletal:negative for myalgias Neurological: positive for dizziness with position change Behavioral/Psych: negative for abusive relationship, depression Endocrine: negative for temperature intolerance   Genitourinary: positive for irregular, crampy periods, suprapubic pain and pain with urination Integument/breast: negative for breast lump, breast tenderness, nipple discharge and skin lesion(s)    Objective:       BP 137/90 mmHg  Pulse 84  Temp(Src) 98.4 F (36.9 C)  Wt 204 lb (92.534 kg) General:   alert  Skin:   no rash or abnormalities  Lungs:   clear to auscultation bilaterally  Heart:   regular rate and rhythm, S1, S2 normal, no murmur, click, rub or gallop  Breasts:   normal without suspicious masses, skin or nipple changes or axillary nodes  Abdomen:  normal findings: no organomegaly, soft, non-tender and no hernia  Pelvis:  External genitalia: normal general appearance Urinary system: urethral meatus normal and bladder without fullness but positive suprapubic tenderness Vaginal: normal without tenderness, induration or masses Cervix: normal appearance Adnexa: normal bimanual exam Uterus: anteverted and non-tender, normal size   Lab Review Urine pregnancy test Labs reviewed yes Radiologic studies reviewed yes    Assessment:    Healthy female exam.    Suprapubic pain and dysuria.  R/O IC.  Uterine fibroids, stable.  Vertigo  Essential Hypertension   Plan:   Referred to Urology Ibuprofen Rx Meclizine Rx   Education reviewed: calcium supplements, low fat, low cholesterol diet, self breast exams and weight bearing exercise. Follow up in: 1 year.   Meds ordered this  encounter  Medications  . ibuprofen (ADVIL,MOTRIN) 800 MG tablet    Sig: Take 1 tablet (800 mg total) by mouth every 8 (eight) hours as needed.    Dispense:  30 tablet    Refill:  5  . lisinopril (PRINIVIL,ZESTRIL) 20 MG tablet    Sig: Take 1 tablet (20 mg total) by mouth daily. Take 1/2 tab daily    Dispense:  30 tablet    Refill:  12  . meclizine (ANTIVERT) 25 MG tablet    Sig: Take 1 tablet (25 mg total) by mouth 3 (three) times daily as needed for dizziness.    Dispense:  30 tablet    Refill:  4   Orders Placed This Encounter  Procedures  . NuSwab Vaginitis Plus (VG+)  . HIV antibody  . Hepatitis B surface antigen  . RPR  . Hepatitis C antibody  . Comprehensive metabolic panel  . TSH  .  CBC  . Ambulatory referral to Urology    Referral Priority:  Routine    Referral Type:  Consultation    Referral Reason:  Specialty Services Required    Requested Specialty:  Urology    Number of Visits Requested:  1  . Ambulatory referral to Internal Medicine    Referral Priority:  Routine    Referral Type:  Consultation    Referral Reason:  Specialty Services Required    Requested Specialty:  Internal Medicine    Number of Visits Requested:  1

## 2015-05-13 LAB — NUSWAB VAGINITIS PLUS (VG+)
CANDIDA ALBICANS, NAA: NEGATIVE
Candida glabrata, NAA: NEGATIVE
Chlamydia trachomatis, NAA: NEGATIVE
Neisseria gonorrhoeae, NAA: NEGATIVE
Trich vag by NAA: NEGATIVE

## 2015-05-14 ENCOUNTER — Telehealth: Payer: Self-pay

## 2015-05-14 LAB — PAP IG AND HPV HIGH-RISK
HPV, high-risk: NEGATIVE
PAP Smear Comment: 0

## 2015-05-14 NOTE — Telephone Encounter (Signed)
called patient regard ing [facility] appt time 06/23/15 at 1:30pm

## 2015-05-20 ENCOUNTER — Ambulatory Visit: Payer: No Typology Code available for payment source

## 2015-05-27 ENCOUNTER — Telehealth: Payer: Self-pay | Admitting: *Deleted

## 2015-05-27 NOTE — Telephone Encounter (Signed)
Patient contacted the office regarding lab results. Attempted to contact the office and left message for patient to call the office.

## 2015-05-28 ENCOUNTER — Ambulatory Visit: Payer: No Typology Code available for payment source

## 2015-05-31 ENCOUNTER — Encounter: Payer: No Typology Code available for payment source | Admitting: Internal Medicine

## 2015-06-02 ENCOUNTER — Ambulatory Visit
Admission: RE | Admit: 2015-06-02 | Discharge: 2015-06-02 | Disposition: A | Discharge status: Home / Self Care | Payer: BLUE CROSS/BLUE SHIELD | Source: Ambulatory Visit | Attending: Certified Nurse Midwife | Admitting: Certified Nurse Midwife

## 2015-06-02 DIAGNOSIS — Z1231 Encounter for screening mammogram for malignant neoplasm of breast: Secondary | ICD-10-CM

## 2015-10-18 ENCOUNTER — Encounter: Payer: Self-pay | Admitting: Family

## 2015-10-18 ENCOUNTER — Ambulatory Visit (INDEPENDENT_AMBULATORY_CARE_PROVIDER_SITE_OTHER): Payer: BLUE CROSS/BLUE SHIELD | Admitting: Family

## 2015-10-18 DIAGNOSIS — W57XXXA Bitten or stung by nonvenomous insect and other nonvenomous arthropods, initial encounter: Secondary | ICD-10-CM | POA: Diagnosis not present

## 2015-10-18 DIAGNOSIS — T148 Other injury of unspecified body region: Secondary | ICD-10-CM

## 2015-10-18 MED ORDER — DOXYCYCLINE HYCLATE 100 MG PO TABS: 100.0000 mg | ORAL_TABLET | Freq: Two times a day (BID) | ORAL | 0 refills | Status: DC

## 2015-10-18 NOTE — Assessment & Plan Note (Signed)
Symptoms and exam consistent with possible insect bite resulting in cellulitis of the surrounding skin. Basic wound care and will cover for infection with doxycycline. Follow up if symptoms worsen or does not approve.

## 2015-10-18 NOTE — Progress Notes (Addendum)
Subjective:    [name], female    DOB: May 13, 1960, 55 y.o.   MRN: [id number]  Chief Complaint  Patient presents with  . Insect Bite    having swelling on right leg behind knee, got bite on saturday    HPI:  Kathryn Parks is a 55 y.o. female who  has a past medical history of Arthritis; DDD (degenerative disc disease), lumbar; Depression; and Hypertension. and presents today for an acute office visit.   This is a new problem. Associated symptom of an insect bite located on her posterior right thigh just proximal to her knee. She has had some redness that may have expanded. Modifying factors include cleansing it with alcohol which has not helped very much. Denies any fevers. Did feel a sting, however does not know what it was.   No Known Allergies   Current Outpatient Prescriptions on File Prior to Visit  Medication Sig Dispense Refill  . Beclomethasone Dipropionate (QNASL) 80 MCG/ACT AERS Place 4 puffs into the nose daily. 8.7 g 11  . cetirizine (ZYRTEC) 10 MG tablet Take 1 tablet (10 mg total) by mouth daily. 30 tablet 11  . estradiol (ESTRACE) 0.1 MG/GM vaginal cream Place one gram cream vaginally 2x weekly.  Taper down as tolerated. 42.5 g 2  . estradiol-levonorgestrel (CLIMARA PRO) 0.045-0.015 MG/DAY Place 1 patch onto the skin once a week. 4 patch 12  . estrogens, conjugated, (PREMARIN) 1.25 MG tablet Take 1 tablet (1.25 mg total) by mouth daily. 30 tablet 5  . ibuprofen (ADVIL,MOTRIN) 800 MG tablet Take 1 tablet (800 mg total) by mouth every 8 (eight) hours as needed. 30 tablet 5  . lisinopril (PRINIVIL,ZESTRIL) 20 MG tablet Take 1 tablet (20 mg total) by mouth daily. Take 1/2 tab daily 30 tablet 12  . meclizine (ANTIVERT) 25 MG tablet Take 1 tablet (25 mg total) by mouth 3 (three) times daily as needed for dizziness. 30 tablet 4  . progesterone (PROMETRIUM) 100 MG capsule Take 1 capsule (100 mg total) by mouth at bedtime. 30 capsule 11  . valACYclovir  (VALTREX) 1000 MG tablet Take 1 tablet po daily for suppression. 30 tablet 11  . citalopram (CELEXA) 10 MG tablet Take 1 tablet (10 mg total) by mouth daily. 30 tablet 12  . FLUoxetine (PROZAC) 20 MG capsule Take 1 capsule (20 mg total) by mouth daily. 30 capsule 11   No current facility-administered medications on file prior to visit.     Review of Systems  Constitutional: Negative for chills and fever.  Cardiovascular: Negative for chest pain, palpitations and leg swelling.  Skin: Positive for wound.  Neurological: Positive for headaches.      Objective:    BP 124/88 (BP Location: Left Arm, Patient Position: Sitting, Cuff Size: Normal)   Pulse 72   Temp 98 F (36.7 C) (Oral)   Resp 16   Ht 5\' 5"  (1.651 m)   Wt 211 lb (95.7 kg)   SpO2 98%   BMI 35.11 kg/m  Nursing note and vital signs reviewed.  Physical Exam  Constitutional: She is oriented to person, place, and time. She appears well-developed and well-nourished. No distress.  Cardiovascular: Normal rate, regular rhythm, normal heart sounds and intact distal pulses.   Pulmonary/Chest: Effort normal and breath sounds normal.  Neurological: She is alert and oriented to person, place, and time.  Skin: Skin is warm and dry. There is erythema (Mild annular elevated area located on her posterior right thigh with small worund.  Mildly tender with no discharge. ).  Psychiatric: She has a normal mood and affect. Her behavior is normal. Judgment and thought content normal.       Assessment & Plan:   Problem List Items Addressed This Visit      Other   Insect bite    Symptoms and exam consistent with possible insect bite resulting in cellulitis of the surrounding skin. Basic wound care and will cover for infection with doxycycline. Follow up if symptoms worsen or does not approve.       Relevant Medications   doxycycline (VIBRA-TABS) 100 MG tablet    Other Visit Diagnoses   None.      I am having Ms. Whidbee start on  doxycycline. I am also having her maintain her cetirizine, Beclomethasone Dipropionate, estradiol, estradiol-levonorgestrel, estrogens (conjugated), progesterone, citalopram, FLUoxetine, valACYclovir, ibuprofen, lisinopril, and meclizine.   Meds ordered this encounter  Medications  . doxycycline (VIBRA-TABS) 100 MG tablet    Sig: Take 1 tablet (100 mg total) by mouth 2 (two) times daily.    Dispense:  20 tablet    Refill:  0    Order Specific Question:   Supervising Provider    Answer:   Pricilla Holm A [id number]     Follow-up: Return if symptoms worsen or fail to improve.  Mauricio Po, FNP

## 2015-10-18 NOTE — Patient Instructions (Signed)
Thank you for choosing [facility].  Summary/Instructions:  Your prescription(s) have been submitted to your pharmacy or been printed and provided for you. Please take as directed and contact our office if you believe you are having problem(s) with the medication(s) or have any questions.  If your symptoms worsen or fail to improve, please contact our office for further instruction, or in case of emergency go directly to the emergency room at the closest medical facility.   Benedryl as needed for itching.  Start the doxycycline 1 pill twice daily.

## 2015-10-19 ENCOUNTER — Ambulatory Visit: Payer: BLUE CROSS/BLUE SHIELD | Admitting: Family

## 2015-10-26 ENCOUNTER — Telehealth: Payer: Self-pay | Admitting: *Deleted

## 2015-10-26 NOTE — Telephone Encounter (Signed)
Kathryn Parks called in and wanted a prescription for flagyl called into to her pharmacy for BV. Patient also requested that a few refills been sent in as she gets them often due to Menopause and send them to [facility] on [address].Please advise.Marland KitchenMarland Kitchen

## 2015-10-27 ENCOUNTER — Other Ambulatory Visit: Payer: Self-pay

## 2015-10-28 ENCOUNTER — Telehealth: Payer: Self-pay | Admitting: *Deleted

## 2015-10-28 NOTE — Telephone Encounter (Signed)
Message left for patient to call office in reference to her call.

## 2015-10-28 NOTE — Telephone Encounter (Signed)
Prescription for Flagyl 500 mg po twice daily x 14 days called to [place].

## 2015-12-12 ENCOUNTER — Other Ambulatory Visit: Payer: Self-pay | Admitting: Obstetrics

## 2015-12-12 DIAGNOSIS — N946 Dysmenorrhea, unspecified: Secondary | ICD-10-CM

## 2015-12-14 NOTE — Telephone Encounter (Signed)
Please review

## 2015-12-23 ENCOUNTER — Ambulatory Visit: Payer: BLUE CROSS/BLUE SHIELD | Admitting: Obstetrics

## 2016-01-05 ENCOUNTER — Other Ambulatory Visit (INDEPENDENT_AMBULATORY_CARE_PROVIDER_SITE_OTHER): Payer: BLUE CROSS/BLUE SHIELD

## 2016-01-05 ENCOUNTER — Encounter: Payer: Self-pay | Admitting: Internal Medicine

## 2016-01-05 ENCOUNTER — Ambulatory Visit (INDEPENDENT_AMBULATORY_CARE_PROVIDER_SITE_OTHER): Payer: BLUE CROSS/BLUE SHIELD | Admitting: Internal Medicine

## 2016-01-05 VITALS — BP 124/80 | HR 69 | Temp 98.3°F | Ht 65.0 in | Wt 212.0 lb

## 2016-01-05 DIAGNOSIS — R270 Ataxia, unspecified: Secondary | ICD-10-CM

## 2016-01-05 DIAGNOSIS — Z0000 Encounter for general adult medical examination without abnormal findings: Secondary | ICD-10-CM

## 2016-01-05 DIAGNOSIS — R51 Headache: Secondary | ICD-10-CM | POA: Diagnosis not present

## 2016-01-05 DIAGNOSIS — R7309 Other abnormal glucose: Secondary | ICD-10-CM

## 2016-01-05 DIAGNOSIS — I10 Essential (primary) hypertension: Secondary | ICD-10-CM

## 2016-01-05 DIAGNOSIS — G4730 Sleep apnea, unspecified: Secondary | ICD-10-CM | POA: Insufficient documentation

## 2016-01-05 DIAGNOSIS — R519 Headache, unspecified: Secondary | ICD-10-CM | POA: Insufficient documentation

## 2016-01-05 LAB — URINALYSIS, ROUTINE W REFLEX MICROSCOPIC
Bilirubin Urine: NEGATIVE
KETONES UR: NEGATIVE
Leukocytes, UA: NEGATIVE
Nitrite: NEGATIVE
SPECIFIC GRAVITY, URINE: 1.015 (ref 1.000–1.030)
Urine Glucose: NEGATIVE
Urobilinogen, UA: 0.2 (ref 0.0–1.0)
pH: 7 (ref 5.0–8.0)

## 2016-01-05 LAB — CBC WITH DIFFERENTIAL/PLATELET
Basophils Absolute: 0 10*3/uL (ref 0.0–0.1)
Basophils Relative: 0.4 % (ref 0.0–3.0)
Eosinophils Absolute: 0.1 10*3/uL (ref 0.0–0.7)
Eosinophils Relative: 1.2 % (ref 0.0–5.0)
HCT: 41.2 % (ref 36.0–46.0)
HEMOGLOBIN: 13.7 g/dL (ref 12.0–15.0)
LYMPHS ABS: 2.2 10*3/uL (ref 0.7–4.0)
Lymphocytes Relative: 39.8 % (ref 12.0–46.0)
MCHC: 33.3 g/dL (ref 30.0–36.0)
MCV: 91.4 fl (ref 78.0–100.0)
MONOS PCT: 6.9 % (ref 3.0–12.0)
Monocytes Absolute: 0.4 10*3/uL (ref 0.1–1.0)
NEUTROS ABS: 2.8 10*3/uL (ref 1.4–7.7)
Neutrophils Relative %: 51.7 % (ref 43.0–77.0)
Platelets: 242 10*3/uL (ref 150.0–400.0)
RBC: 4.51 Mil/uL (ref 3.87–5.11)
RDW: 15 % (ref 11.5–15.5)
WBC: 5.5 10*3/uL (ref 4.0–10.5)

## 2016-01-05 LAB — COMPREHENSIVE METABOLIC PANEL
ALT: 17 U/L (ref 0–35)
AST: 19 U/L (ref 0–37)
Albumin: 4.4 g/dL (ref 3.5–5.2)
Alkaline Phosphatase: 75 U/L (ref 39–117)
BUN: 13 mg/dL (ref 6–23)
CHLORIDE: 108 meq/L (ref 96–112)
CO2: 24 meq/L (ref 19–32)
Calcium: 9.8 mg/dL (ref 8.4–10.5)
Creatinine, Ser: 0.89 mg/dL (ref 0.40–1.20)
GFR: 84.15 mL/min (ref >60.00)
GLUCOSE: 94 mg/dL (ref 70–99)
POTASSIUM: 3.9 meq/L (ref 3.5–5.1)
Sodium: 141 mEq/L (ref 135–145)
Total Bilirubin: 0.2 mg/dL (ref 0.2–1.2)
Total Protein: 7.6 g/dL (ref 6.0–8.3)

## 2016-01-05 LAB — LIPID PANEL
CHOL/HDL RATIO: 4
Cholesterol: 179 mg/dL (ref 0–200)
HDL: 50.8 mg/dL (ref >39.00)
LDL Cholesterol: 105 mg/dL — ABNORMAL HIGH (ref 0–99)
NonHDL: 128.63
Triglycerides: 118 mg/dL (ref 0.0–149.0)
VLDL: 23.6 mg/dL (ref 0.0–40.0)

## 2016-01-05 LAB — TSH: TSH: 0.87 u[IU]/mL (ref 0.35–4.50)

## 2016-01-05 LAB — HEMOGLOBIN A1C: HEMOGLOBIN A1C: 6.2 % (ref 4.6–6.5)

## 2016-01-05 NOTE — Progress Notes (Signed)
Pre visit review using our clinic review tool, if applicable. No additional management support is needed unless otherwise documented below in the visit note. 

## 2016-01-05 NOTE — Progress Notes (Signed)
Subjective:  [name], female    DOB: 1960-07-04  Age: 55 y.o. MRN: [id number]  CC: Hypertension and Annual Exam   HPI Kathryn Parks presents for a CPX.  She complains of a 6 week history of diffuse, bilateral headache in her temples with intermittent vertigo and ataxia. She complains when she lays down and turns her head from side to side she feels a spinning sensation. She has had no changes in her vision or hearing. She denies specific areas of paresthesias. She's had this off and on for several years but the symptoms have worsened dramatically over the last 6 weeks.  She also complains of ongoing symptoms of sleep apnea with heavy snoring and witnessed apnea. People tell her she gasps and chokes during the night.  Outpatient Medications Prior to Visit  Medication Sig Dispense Refill  . cetirizine (ZYRTEC) 10 MG tablet Take 1 tablet (10 mg total) by mouth daily. 30 tablet 11  . lisinopril (PRINIVIL,ZESTRIL) 20 MG tablet Take 1 tablet (20 mg total) by mouth daily. Take 1/2 tab daily 30 tablet 12  . meclizine (ANTIVERT) 25 MG tablet Take 1 tablet (25 mg total) by mouth 3 (three) times daily as needed for dizziness. 30 tablet 4  . valACYclovir (VALTREX) 1000 MG tablet Take 1 tablet po daily for suppression. 30 tablet 11  . citalopram (CELEXA) 10 MG tablet Take 1 tablet (10 mg total) by mouth daily. 30 tablet 12  . Beclomethasone Dipropionate (QNASL) 80 MCG/ACT AERS Place 4 puffs into the nose daily. 8.7 g 11  . doxycycline (VIBRA-TABS) 100 MG tablet Take 1 tablet (100 mg total) by mouth 2 (two) times daily. 20 tablet 0  . estradiol (ESTRACE) 0.1 MG/GM vaginal cream Place one gram cream vaginally 2x weekly.  Taper down as tolerated. 42.5 g 2  . estradiol-levonorgestrel (CLIMARA PRO) 0.045-0.015 MG/DAY Place 1 patch onto the skin once a week. 4 patch 12  . estrogens, conjugated, (PREMARIN) 1.25 MG tablet Take 1 tablet (1.25 mg total) by mouth daily. 30 tablet 5  .  FLUoxetine (PROZAC) 20 MG capsule Take 1 capsule (20 mg total) by mouth daily. 30 capsule 11  . ibuprofen (ADVIL,MOTRIN) 800 MG tablet Take 1 tablet (800 mg total) by mouth every 8 (eight) hours as needed. 30 tablet 5  . ibuprofen (ADVIL,MOTRIN) 800 MG tablet TAKE 1 TABLET (800 MG TOTAL) BY MOUTH EVERY 8 (EIGHT) HOURS AS NEEDED. 30 tablet 0  . progesterone (PROMETRIUM) 100 MG capsule Take 1 capsule (100 mg total) by mouth at bedtime. 30 capsule 11   No facility-administered medications prior to visit.     ROS Review of Systems  Constitutional: Negative.  Negative for activity change, appetite change, chills, diaphoresis, fatigue, fever and unexpected weight change.  HENT: Negative.   Eyes: Negative.  Negative for photophobia and visual disturbance.  Respiratory: Positive for apnea. Negative for cough, choking, shortness of breath, wheezing and stridor.   Cardiovascular: Negative for chest pain, palpitations and leg swelling.  Gastrointestinal: Negative.  Negative for abdominal pain, constipation, diarrhea, nausea and vomiting.  Endocrine: Negative.   Genitourinary: Negative.  Negative for difficulty urinating, dysuria and frequency.  Musculoskeletal: Positive for gait problem. Negative for arthralgias, back pain, myalgias and neck pain.  Skin: Negative.  Negative for color change and rash.  Neurological: Positive for dizziness and headaches. Negative for tremors, syncope, facial asymmetry, speech difficulty, weakness, light-headedness and numbness.  Hematological: Negative.  Negative for adenopathy. Does not bruise/bleed easily.  Psychiatric/Behavioral: Negative.  Negative for dysphoric mood, sleep disturbance and suicidal ideas. The patient is not nervous/anxious.     Objective:  BP 124/80 (BP Location: Left Arm, Patient Position: Sitting, Cuff Size: Large)   Pulse 69   Temp 98.3 F (36.8 C) (Oral)   Ht 5\' 5"  (1.651 m)   Wt 212 lb (96.2 kg)   SpO2 98%   BMI 35.28 kg/m   BP  Readings from Last 3 Encounters:  01/05/16 124/80  10/18/15 124/88  05/10/15 137/90    Wt Readings from Last 3 Encounters:  01/05/16 212 lb (96.2 kg)  10/18/15 211 lb (95.7 kg)  05/10/15 204 lb (92.5 kg)    Physical Exam  Constitutional: She is oriented to person, place, and time. No distress.  HENT:  Head: Normocephalic and atraumatic.  Mouth/Throat: Oropharynx is clear and moist. No oropharyngeal exudate.  Eyes: Conjunctivae and EOM are normal. Pupils are equal, round, and reactive to light. Right eye exhibits no discharge. Left eye exhibits no discharge. No scleral icterus.  Neck: Normal range of motion. Neck supple. No JVD present. No tracheal deviation present. No thyromegaly present.  Cardiovascular: Normal rate, regular rhythm, S1 normal, S2 normal and intact distal pulses.  Exam reveals no gallop and no friction rub.   Murmur heard.  Decrescendo systolic murmur is present with a grade of 1/6   No diastolic murmur is present  Pulmonary/Chest: Effort normal and breath sounds normal. No stridor. No respiratory distress. She has no wheezes. She has no rales. She exhibits no tenderness.  Abdominal: Soft. Bowel sounds are normal. She exhibits no distension and no mass. There is no tenderness. There is no rebound and no guarding.  Musculoskeletal: Normal range of motion. She exhibits no edema, tenderness or deformity.  Lymphadenopathy:    She has no cervical adenopathy.  Neurological: She is alert and oriented to person, place, and time. She has normal reflexes. She displays normal reflexes. No cranial nerve deficit. She exhibits normal muscle tone. Coordination normal.  Skin: Skin is warm and dry. No rash noted. She is not diaphoretic. No erythema. No pallor.  Psychiatric: She has a normal mood and affect. Her behavior is normal. Judgment and thought content normal.  Vitals reviewed.   Lab Results  Component Value Date   WBC 5.5 01/05/2016   HGB 13.7 01/05/2016   HCT 41.2  01/05/2016   PLT 242.0 01/05/2016   GLUCOSE 94 01/05/2016   CHOL 179 01/05/2016   TRIG 118.0 01/05/2016   HDL 50.80 01/05/2016   LDLCALC 105 (H) 01/05/2016   ALT 17 01/05/2016   AST 19 01/05/2016   NA 141 01/05/2016   K 3.9 01/05/2016   CL 108 01/05/2016   CREATININE 0.89 01/05/2016   BUN 13 01/05/2016   CO2 24 01/05/2016   TSH 0.87 01/05/2016   HGBA1C 6.2 01/05/2016    Mm Digital Screening Bilateral  Result Date: 06/02/2015 CLINICAL DATA:  Screening. EXAM: DIGITAL SCREENING BILATERAL MAMMOGRAM WITH CAD COMPARISON:  Previous exam(s). ACR Breast Density Category b: There are scattered areas of fibroglandular density. FINDINGS: There are no findings suspicious for malignancy. Images were processed with CAD. IMPRESSION: No mammographic evidence of malignancy. A result letter of this screening mammogram will be mailed directly to the patient. RECOMMENDATION: Screening mammogram in one year. (Code:SM-B-01Y) BI-RADS CATEGORY  1: Negative. Electronically Signed   By: Claudie Revering M.D.   On: 06/02/2015 16:53    Assessment & Plan:   Lauryn was seen today for hypertension and annual exam.  Diagnoses and all orders for this visit:  Essential hypertension, benign- her blood pressure is adequately well controlled on the ACE inhibitor.  Other abnormal glucose- her A1c is up to 6.2%, she is prediabetic, no medications are needed at this time, she agrees to work on her lifestyle modifications. -     Hemoglobin A1c; Future  Routine general medical examination at a health care facility- Exam completed, labs ordered and reviewed, vaccines reviewed and updated, her colonoscopy/Pap smear/mammogram are all up-to-date, patient education material was given. -     Lipid panel; Future -     Comprehensive metabolic panel; Future -     CBC with Differential/Platelet; Future -     TSH; Future -     Urinalysis, Routine w reflex microscopic (not at [facility]); Future  Ataxia- I have ordered an MRI of her brain  to screen for CNS pathology such as mass, CVA, demyelination. -     MR BRAIN WO CONTRAST; Future  Nonintractable episodic headache, unspecified headache type- as above -     MR BRAIN WO CONTRAST; Future  Sleep apnea, unspecified type- I've asked her to see sleep medicine to be evaluated and treated for sleep apnea. -     Ambulatory referral to Sleep Studies   I have discontinued Ms. Sebastiani's Beclomethasone Dipropionate, estradiol, estradiol-levonorgestrel, estrogens (conjugated), progesterone, FLUoxetine, ibuprofen, doxycycline, and ibuprofen. I am also having her maintain her cetirizine, citalopram, valACYclovir, lisinopril, and meclizine.  No orders of the defined types were placed in this encounter.    Follow-up: Return in about 4 weeks (around 02/02/2016).  [name], MD

## 2016-01-05 NOTE — Patient Instructions (Signed)

## 2016-02-09 ENCOUNTER — Ambulatory Visit: Payer: BLUE CROSS/BLUE SHIELD | Admitting: Obstetrics

## 2016-05-10 ENCOUNTER — Encounter: Payer: Self-pay | Admitting: Obstetrics

## 2016-05-10 ENCOUNTER — Ambulatory Visit (INDEPENDENT_AMBULATORY_CARE_PROVIDER_SITE_OTHER): Payer: BLUE CROSS/BLUE SHIELD | Admitting: Obstetrics

## 2016-05-10 VITALS — BP 132/83 | HR 77 | Ht 65.0 in | Wt 214.0 lb

## 2016-05-10 DIAGNOSIS — R102 Pelvic and perineal pain: Secondary | ICD-10-CM

## 2016-05-10 DIAGNOSIS — F419 Anxiety disorder, unspecified: Secondary | ICD-10-CM

## 2016-05-10 DIAGNOSIS — I10 Essential (primary) hypertension: Secondary | ICD-10-CM

## 2016-05-10 DIAGNOSIS — A6004 Herpesviral vulvovaginitis: Secondary | ICD-10-CM

## 2016-05-10 DIAGNOSIS — Z0000 Encounter for general adult medical examination without abnormal findings: Secondary | ICD-10-CM | POA: Diagnosis not present

## 2016-05-10 DIAGNOSIS — Z01411 Encounter for gynecological examination (general) (routine) with abnormal findings: Secondary | ICD-10-CM

## 2016-05-10 DIAGNOSIS — G8929 Other chronic pain: Secondary | ICD-10-CM

## 2016-05-10 DIAGNOSIS — Z01419 Encounter for gynecological examination (general) (routine) without abnormal findings: Secondary | ICD-10-CM

## 2016-05-10 DIAGNOSIS — Z113 Encounter for screening for infections with a predominantly sexual mode of transmission: Secondary | ICD-10-CM

## 2016-05-10 DIAGNOSIS — Z124 Encounter for screening for malignant neoplasm of cervix: Secondary | ICD-10-CM

## 2016-05-10 DIAGNOSIS — N393 Stress incontinence (female) (male): Secondary | ICD-10-CM

## 2016-05-10 LAB — HM PAP SMEAR

## 2016-05-10 LAB — POCT URINALYSIS DIPSTICK
BILIRUBIN UA: NEGATIVE
GLUCOSE UA: NEGATIVE
Ketones, UA: NEGATIVE
Leukocytes, UA: NEGATIVE
Nitrite, UA: NEGATIVE
SPEC GRAV UA: 1.015
UROBILINOGEN UA: 0.2
pH, UA: 7

## 2016-05-10 MED ORDER — CITALOPRAM HYDROBROMIDE 10 MG PO TABS: 10.0000 mg | ORAL_TABLET | Freq: Every day | ORAL | 12 refills | Status: DC

## 2016-05-10 MED ORDER — LISINOPRIL 20 MG PO TABS: 20.0000 mg | ORAL_TABLET | Freq: Every day | ORAL | 12 refills | Status: DC

## 2016-05-10 MED ORDER — IBUPROFEN 800 MG PO TABS: 800.0000 mg | ORAL_TABLET | Freq: Three times a day (TID) | ORAL | 5 refills | Status: DC | PRN

## 2016-05-10 MED ORDER — VALACYCLOVIR HCL 1 G PO TABS: ORAL_TABLET | ORAL | 11 refills | Status: DC

## 2016-05-10 MED ORDER — OXYCODONE-ACETAMINOPHEN 10-325 MG PO TABS: 1.0000 | ORAL_TABLET | Freq: Four times a day (QID) | ORAL | 0 refills | Status: DC | PRN

## 2016-05-10 NOTE — Addendum Note (Signed)
Addended by: Maryruth Eve on: 05/10/2016 09:34 AM   Modules accepted: Orders

## 2016-05-10 NOTE — Progress Notes (Signed)
Subjective:        Kathryn Parks is a 56 y.o. female here for a routine exam.  Current complaints: The patient has history of chronic pelvic pain.  Also has leaking of urine with cough and sneeze and urge incontinence.  Was seen by Urology and cystoscopy was negative.  She has not been back for follow up for any treatment.  She continue to have this shooting type pain every day.  Personal health questionnaire:  Is patient Ashkenazi Jewish, have a family history of breast and/or ovarian cancer: no Is there a family history of uterine cancer diagnosed at age < 10, gastrointestinal cancer, urinary tract cancer, family member who is a Field seismologist syndrome-associated carrier: no Is the patient overweight and hypertensive, family history of diabetes, personal history of gestational diabetes, preeclampsia or PCOS: no Is patient over 71, have PCOS,  family history of premature CHD under age 7, diabetes, smoke, have hypertension or peripheral artery disease:  no At any time, has a partner hit, kicked or otherwise hurt or frightened you?: no Over the past 2 weeks, have you felt down, depressed or hopeless?: no Over the past 2 weeks, have you felt little interest or pleasure in doing things?:no   Gynecologic History No LMP recorded (lmp unknown). [name]. Contraception: tubal ligation                                                                                                                                                         Last Pap: 2017. Results were: normal Last mammogram: 2017. Results were: normal  Obstetric History OB History  Gravida Para Term Preterm AB Living  1 1 1  0 0 1  SAB TAB Ectopic Multiple Live Births  0 0 0 0 1    # Outcome Date GA Lbr Len/2nd Weight Sex Delivery Anes PTL Lv  1 Term 02/03/95 [redacted]w[redacted]d  6 lb 9 oz (2.977 kg) F Vag-Spont EPI  LIV      Past Medical History:  Diagnosis Date  . Arthritis   . DDD  (degenerative disc disease), lumbar   . Depression   . Hypertension     Past Surgical History:  Procedure Laterality Date  . TUBAL LIGATION  1997     Current Outpatient Prescriptions:  .  cetirizine (ZYRTEC) 10 MG tablet, Take 1 tablet (10 mg total) by mouth daily., Disp: 30 tablet, Rfl: 11 .  citalopram (CELEXA) 10 MG tablet, Take 1 tablet (10 mg total) by mouth daily., Disp: 30 tablet, Rfl: 12 .  ibuprofen (ADVIL,MOTRIN) 800 MG tablet, Take 1 tablet (800 mg total) by mouth every 8 (eight) hours as needed., Disp: 30 tablet, Rfl: 5 .  lisinopril (PRINIVIL,ZESTRIL) 20 MG tablet, Take 1 tablet (20 mg total) by mouth daily. Take 1/2  tab daily, Disp: 30 tablet, Rfl: 12 .  meclizine (ANTIVERT) 25 MG tablet, Take 1 tablet (25 mg total) by mouth 3 (three) times daily as needed for dizziness., Disp: 30 tablet, Rfl: 4 .  oxyCODONE-acetaminophen (PERCOCET) 10-325 MG tablet, Take 1 tablet by mouth every 6 (six) hours as needed for pain., Disp: 30 tablet, Rfl: 0 .  valACYclovir (VALTREX) 1000 MG tablet, Take 1 tablet po daily for suppression., Disp: 30 tablet, Rfl: 11 No Known Allergies  Social History  Substance Use Topics  . Smoking status: Never Smoker  . Smokeless tobacco: Never Used  . Alcohol use No     Comment: Ocassionally    Family History  Problem Relation Age of Onset  . Heart disease Father   . Hypertension Father   . Diabetes Father   . Diabetes Mother   . Birth defects Neg Hx   . Cancer Neg Hx   . Alcohol abuse Neg Hx   . Drug abuse Neg Hx   . Early death Neg Hx   . Hyperlipidemia Neg Hx   . Kidney disease Neg Hx   . Stroke Neg Hx   . Colon cancer Neg Hx       Review of Systems  Constitutional: negative for fatigue and weight loss Respiratory: negative for cough and wheezing Cardiovascular: negative for chest pain, fatigue and palpitations Gastrointestinal: negative for abdominal pain and change in bowel habits Musculoskeletal:negative for myalgias Neurological:  negative for gait problems and tremors Behavioral/Psych: negative for abusive relationship, depression Endocrine: negative for temperature intolerance    Genitourinary:negative for abnormal menstrual periods, genital lesions, hot flashes, sexual problems and vaginal discharge Integument/breast: negative for breast lump, breast tenderness, nipple discharge and skin lesion(s)    Objective:       BP 132/83   Pulse 77   Ht 5\' 5"  (1.651 m)   Wt 214 lb (97.1 kg)   LMP  (LMP Unknown) Comment: intermittent spotting   BMI 35.61 kg/m  General:   alert  Skin:   no rash or abnormalities  Lungs:   clear to auscultation bilaterally  Heart:   regular rate and rhythm, S1, S2 normal, no murmur, click, rub or gallop  Breasts:   normal without suspicious masses, skin or nipple changes or axillary nodes  Abdomen:  normal findings: no organomegaly, soft, non-tender and no hernia  Pelvis:  External genitalia: normal general appearance Urinary system: urethral meatus normal and bladder without fullness, nontender Vaginal: normal without tenderness, induration or masses Cervix: normal appearance Adnexa: normal bimanual exam Uterus: anteverted and non-tender, normal size   Lab Review Urine pregnancy test Labs reviewed yes Radiologic studies reviewed yes  50% of 20 min visit spent on counseling and coordination of care.    Assessment:    Healthy female exam.    Chronic Pelvic Pain.  Uterine Fibroids.  SUI and Urge Incontinence     Plan:   Referred to Urogynecology for further evaluation and management  Education reviewed: calcium supplements, depression evaluation, low fat, low cholesterol diet, safe sex/STD prevention, self breast exams and weight bearing exercise. Mammogram ordered. Follow up in: 1 year.   Meds ordered this encounter  Medications  . DISCONTD: ibuprofen (ADVIL,MOTRIN) 800 MG tablet    Sig: Take 800 mg by mouth every 8 (eight) hours as needed.  Marland Kitchen DISCONTD:  oxyCODONE-acetaminophen (PERCOCET) 10-325 MG tablet    Sig: Take 1 tablet by mouth every 4 (four) hours as needed for pain.  Marland Kitchen lisinopril (PRINIVIL,ZESTRIL) 20 MG  tablet    Sig: Take 1 tablet (20 mg total) by mouth daily. Take 1/2 tab daily    Dispense:  30 tablet    Refill:  12  . ibuprofen (ADVIL,MOTRIN) 800 MG tablet    Sig: Take 1 tablet (800 mg total) by mouth every 8 (eight) hours as needed.    Dispense:  30 tablet    Refill:  5  . valACYclovir (VALTREX) 1000 MG tablet    Sig: Take 1 tablet po daily for suppression.    Dispense:  30 tablet    Refill:  11  . citalopram (CELEXA) 10 MG tablet    Sig: Take 1 tablet (10 mg total) by mouth daily.    Dispense:  30 tablet    Refill:  12  . oxyCODONE-acetaminophen (PERCOCET) 10-325 MG tablet    Sig: Take 1 tablet by mouth every 6 (six) hours as needed for pain.    Dispense:  30 tablet    Refill:  0   Orders Placed This Encounter  Procedures  . Hepatitis B surface antigen  . Hepatitis C antibody  . HIV antibody  . RPR  . Ambulatory referral to Urogynecology    Referral Priority:   Routine    Referral Type:   Consultation    Referral Reason:   Specialty Services Required    Requested Specialty:   Urology    Number of Visits Requested:   1     [name], female   DOB: [birth year], 56 y.o.   MRN: [id number]

## 2016-05-10 NOTE — Addendum Note (Signed)
Addended by: Baltazar Najjar A on: 05/10/2016 10:24 AM   Modules accepted: Orders

## 2016-05-10 NOTE — Progress Notes (Signed)
Pt presents for Annual, pap, and all STD testing. Pt c/o intermittent sharp pelvic pain.

## 2016-05-11 ENCOUNTER — Ambulatory Visit: Payer: Self-pay | Admitting: Obstetrics

## 2016-05-11 LAB — RPR: RPR: NONREACTIVE

## 2016-05-11 LAB — CERVICOVAGINAL ANCILLARY ONLY
Bacterial vaginitis: POSITIVE — AB
Candida vaginitis: NEGATIVE
Chlamydia: NEGATIVE
NEISSERIA GONORRHEA: NEGATIVE
Trichomonas: NEGATIVE

## 2016-05-11 LAB — HIV ANTIBODY (ROUTINE TESTING W REFLEX): HIV SCREEN 4TH GENERATION: NONREACTIVE

## 2016-05-11 LAB — HEPATITIS B SURFACE ANTIGEN: Hepatitis B Surface Ag: NEGATIVE

## 2016-05-11 LAB — HEPATITIS C ANTIBODY: Hep C Virus Ab: <0.1 s/co ratio (ref 0.0–0.9)

## 2016-05-12 ENCOUNTER — Other Ambulatory Visit: Payer: Self-pay | Admitting: Obstetrics

## 2016-05-12 DIAGNOSIS — B9689 Other specified bacterial agents as the cause of diseases classified elsewhere: Secondary | ICD-10-CM

## 2016-05-12 DIAGNOSIS — N760 Acute vaginitis: Principal | ICD-10-CM

## 2016-05-12 LAB — CYTOLOGY - PAP
Diagnosis: NEGATIVE
HPV: NOT DETECTED

## 2016-05-12 LAB — URINE CULTURE

## 2016-05-12 MED ORDER — METRONIDAZOLE 500 MG PO TABS: 500.0000 mg | ORAL_TABLET | Freq: Two times a day (BID) | ORAL | 2 refills | Status: DC

## 2016-06-20 ENCOUNTER — Encounter (INDEPENDENT_AMBULATORY_CARE_PROVIDER_SITE_OTHER): Payer: BLUE CROSS/BLUE SHIELD | Admitting: Ophthalmology

## 2016-08-07 ENCOUNTER — Other Ambulatory Visit: Payer: Self-pay | Admitting: Internal Medicine

## 2016-08-07 DIAGNOSIS — Z1231 Encounter for screening mammogram for malignant neoplasm of breast: Secondary | ICD-10-CM

## 2016-08-14 ENCOUNTER — Ambulatory Visit: Payer: BLUE CROSS/BLUE SHIELD

## 2016-08-28 ENCOUNTER — Ambulatory Visit: Payer: BLUE CROSS/BLUE SHIELD

## 2016-10-11 DIAGNOSIS — N950 Postmenopausal bleeding: Secondary | ICD-10-CM | POA: Insufficient documentation

## 2016-10-16 ENCOUNTER — Encounter (INDEPENDENT_AMBULATORY_CARE_PROVIDER_SITE_OTHER): Payer: BLUE CROSS/BLUE SHIELD | Admitting: Ophthalmology

## 2016-10-16 DIAGNOSIS — I10 Essential (primary) hypertension: Secondary | ICD-10-CM

## 2016-10-16 DIAGNOSIS — H43813 Vitreous degeneration, bilateral: Secondary | ICD-10-CM

## 2016-10-16 DIAGNOSIS — H2513 Age-related nuclear cataract, bilateral: Secondary | ICD-10-CM

## 2016-10-16 DIAGNOSIS — H35033 Hypertensive retinopathy, bilateral: Secondary | ICD-10-CM

## 2016-11-28 ENCOUNTER — Encounter: Payer: Self-pay | Admitting: *Deleted

## 2016-12-25 ENCOUNTER — Telehealth: Payer: Self-pay | Admitting: Pediatrics

## 2016-12-25 DIAGNOSIS — I10 Essential (primary) hypertension: Secondary | ICD-10-CM

## 2016-12-25 NOTE — Telephone Encounter (Signed)
Pharmacy faxed request for 90 day supply (instead of 30 day) of Lisinopril. Please advise.

## 2016-12-26 MED ORDER — LISINOPRIL 20 MG PO TABS: 20.0000 mg | ORAL_TABLET | Freq: Every day | ORAL | 3 refills | Status: DC

## 2016-12-26 NOTE — Telephone Encounter (Signed)
OK for 90day supply

## 2017-05-10 ENCOUNTER — Ambulatory Visit: Payer: BLUE CROSS/BLUE SHIELD | Admitting: Internal Medicine

## 2017-05-10 ENCOUNTER — Encounter: Payer: Self-pay | Admitting: Internal Medicine

## 2017-05-10 VITALS — BP 136/80 | HR 88 | Temp 98.5°F | Resp 16 | Ht 65.0 in | Wt 210.0 lb

## 2017-05-10 DIAGNOSIS — M4802 Spinal stenosis, cervical region: Secondary | ICD-10-CM

## 2017-05-10 DIAGNOSIS — M79641 Pain in right hand: Secondary | ICD-10-CM

## 2017-05-10 DIAGNOSIS — R102 Pelvic and perineal pain: Secondary | ICD-10-CM

## 2017-05-10 DIAGNOSIS — G8929 Other chronic pain: Secondary | ICD-10-CM

## 2017-05-10 MED ORDER — CYCLOBENZAPRINE HCL 5 MG PO TABS: 5.0000 mg | ORAL_TABLET | Freq: Three times a day (TID) | ORAL | 5 refills | Status: DC | PRN

## 2017-05-10 MED ORDER — OXYCODONE-ACETAMINOPHEN 10-325 MG PO TABS: 1.0000 | ORAL_TABLET | Freq: Three times a day (TID) | ORAL | 0 refills | Status: DC | PRN

## 2017-05-10 NOTE — Progress Notes (Signed)
Subjective:  [name], female    DOB: [birth year]  Age: 57 y.o. MRN: [id number]  CC: Neck Pain   HPI Kathryn Parks presents for concerns about worsening posterior neck pain.  She has a history of cervical disc disease and carpal tunnel syndrome.  She complains of a sharp pain that intermittently radiates into her right upper extremity.  She has had this worsening over the last few years.  She is getting symptom relief with the combination of ibuprofen and Percocet.  She previously took Flexeril as well and wants a refill on that.  She complains of worsening weakness in both upper extremities.  Outpatient Medications Prior to Visit  Medication Sig Dispense Refill  . cetirizine (ZYRTEC) 10 MG tablet Take 1 tablet (10 mg total) by mouth daily. 30 tablet 11  . citalopram (CELEXA) 10 MG tablet Take 1 tablet (10 mg total) by mouth daily. 30 tablet 12  . ibuprofen (ADVIL,MOTRIN) 800 MG tablet Take 1 tablet (800 mg total) by mouth every 8 (eight) hours as needed. 30 tablet 5  . lisinopril (PRINIVIL,ZESTRIL) 20 MG tablet Take 1 tablet (20 mg total) by mouth daily. 90 tablet 3  . meclizine (ANTIVERT) 25 MG tablet Take 1 tablet (25 mg total) by mouth 3 (three) times daily as needed for dizziness. 30 tablet 4  . valACYclovir (VALTREX) 1000 MG tablet Take 1 tablet po daily for suppression. 30 tablet 11  . metroNIDAZOLE (FLAGYL) 500 MG tablet Take 1 tablet (500 mg total) by mouth 2 (two) times daily. 14 tablet 2  . oxyCODONE-acetaminophen (PERCOCET) 10-325 MG tablet Take 1 tablet by mouth every 6 (six) hours as needed for pain. 30 tablet 0   No facility-administered medications prior to visit.     ROS Review of Systems  Constitutional: Negative for fatigue, fever and unexpected weight change.  HENT: Negative.  Negative for trouble swallowing.   Eyes: Negative for visual disturbance.  Respiratory: Negative for cough, chest tightness, shortness of breath and wheezing.     Cardiovascular: Negative for chest pain, palpitations and leg swelling.  Gastrointestinal: Negative for abdominal pain, constipation, diarrhea, nausea and vomiting.  Endocrine: Negative.   Genitourinary: Negative.  Negative for difficulty urinating.  Musculoskeletal: Positive for neck pain. Negative for arthralgias, back pain and joint swelling.  Skin: Negative.  Negative for color change, pallor and rash.  Allergic/Immunologic: Negative.   Neurological: Positive for weakness. Negative for dizziness and numbness.  Hematological: Negative for adenopathy. Does not bruise/bleed easily.  Psychiatric/Behavioral: Negative.     Objective:  BP 136/80 (BP Location: Left Arm, Patient Position: Sitting, Cuff Size: Large)   Pulse 88   Temp 98.5 F (36.9 C) (Oral)   Resp 16   Ht 5\' 5"  (1.651 m)   Wt 210 lb (95.3 kg)   SpO2 94%   BMI 34.95 kg/m   BP Readings from Last 3 Encounters:  05/10/17 136/80  05/10/16 132/83  01/05/16 124/80    Wt Readings from Last 3 Encounters:  05/10/17 210 lb (95.3 kg)  05/10/16 214 lb (97.1 kg)  01/05/16 212 lb (96.2 kg)    Physical Exam  Constitutional: No distress.  HENT:  Mouth/Throat: Oropharynx is clear and moist. No oropharyngeal exudate.  Eyes: Conjunctivae are normal. Left eye exhibits no discharge. No scleral icterus.  Neck: Normal range of motion. Neck supple. No JVD present. No thyromegaly present.  Cardiovascular: Normal rate, regular rhythm and normal heart sounds. Exam reveals no gallop and no friction rub.  No  murmur heard. Pulmonary/Chest: Effort normal and breath sounds normal. No respiratory distress. She has no wheezes. She has no rales.  Abdominal: Soft. Bowel sounds are normal. She exhibits no distension and no mass. There is no tenderness. There is no guarding.  Musculoskeletal:       Cervical back: She exhibits normal range of motion, no tenderness, no bony tenderness, no deformity, no pain and no spasm.  Lymphadenopathy:    She  has no cervical adenopathy.  Neurological: She has normal strength. She displays no atrophy and no tremor. No cranial nerve deficit or sensory deficit. She exhibits abnormal muscle tone. She displays a negative Romberg sign. She displays no seizure activity. Coordination and gait normal.  Reflex Scores:      Tricep reflexes are 0 on the right side and 0 on the left side.      Bicep reflexes are 1+ on the right side and 1+ on the left side.      Brachioradialis reflexes are 0 on the right side and 0 on the left side.      Patellar reflexes are 0 on the right side and 0 on the left side.      Achilles reflexes are 0 on the right side and 0 on the left side. There is mild weakness, symmetrically in both upper extremities.  Skin: She is not diaphoretic.  Vitals reviewed.   Lab Results  Component Value Date   WBC 5.5 01/05/2016   HGB 13.7 01/05/2016   HCT 41.2 01/05/2016   PLT 242.0 01/05/2016   GLUCOSE 94 01/05/2016   CHOL 179 01/05/2016   TRIG 118.0 01/05/2016   HDL 50.80 01/05/2016   LDLCALC 105 (H) 01/05/2016   ALT 17 01/05/2016   AST 19 01/05/2016   NA 141 01/05/2016   K 3.9 01/05/2016   CL 108 01/05/2016   CREATININE 0.89 01/05/2016   BUN 13 01/05/2016   CO2 24 01/05/2016   TSH 0.87 01/05/2016   HGBA1C 6.2 01/05/2016      2014 - IMPRESSION:  1.  Multilevel cervical disc and endplate degeneration.  Small disc herniations at C4-C5 and C5-C6. 2.  Multifactorial spinal stenosis with mild spinal cord mass effect at the C5-C6 and C6-C7.  No cord signal abnormality. 3.  Multifactorial moderate to severe neural foraminal stenosis at the bilateral C4, C5, and C6 nerve levels (slightly worse on the right at each level). 4.  Lumbar findings are below.   Mm Digital Screening Bilateral  Result Date: 06/02/2015 CLINICAL DATA:  Screening. EXAM: DIGITAL SCREENING BILATERAL MAMMOGRAM WITH CAD COMPARISON:  Previous exam(s). ACR Breast Density Category b: There are scattered areas  of fibroglandular density. FINDINGS: There are no findings suspicious for malignancy. Images were processed with CAD. IMPRESSION: No mammographic evidence of malignancy. A result letter of this screening mammogram will be mailed directly to the patient. RECOMMENDATION: Screening mammogram in one year. (Code:SM-B-01Y) BI-RADS CATEGORY  1: Negative. Electronically Signed   By: Claudie Revering M.D.   On: 06/02/2015 16:53    Assessment & Plan:   Kathryn Parks was seen today for neck pain.  Diagnoses and all orders for this visit:  Spinal stenosis in cervical region- Will continue to control the pain with Percocet, ibuprofen, and Flexeril.  I have asked her to see pain management for additional treatment options for the pain.  I have asked her to see neurosurgery to see if she is a candidate for surgical intervention. -     oxyCODONE-acetaminophen (PERCOCET) 10-325 MG tablet;  Take 1 tablet by mouth every 8 (eight) hours as needed for pain. -     Ambulatory referral to Pain Clinic -     Ambulatory referral to Neurosurgery -     cyclobenzaprine (FLEXERIL) 5 MG tablet; Take 1 tablet (5 mg total) by mouth 3 (three) times daily as needed for muscle spasms.  Chronic pelvic pain in female  Chronic hand pain, right   I have discontinued Kathryn Parks's metroNIDAZOLE. I have also changed her oxyCODONE-acetaminophen. Additionally, I am having her maintain her cetirizine, meclizine, ibuprofen, valACYclovir, citalopram, lisinopril, and cyclobenzaprine.  Meds ordered this encounter  Medications  . oxyCODONE-acetaminophen (PERCOCET) 10-325 MG tablet    Sig: Take 1 tablet by mouth every 8 (eight) hours as needed for pain.    Dispense:  60 tablet    Refill:  0  . cyclobenzaprine (FLEXERIL) 5 MG tablet    Sig: Take 1 tablet (5 mg total) by mouth 3 (three) times daily as needed for muscle spasms.    Dispense:  60 tablet    Refill:  5     Follow-up: Return in about 4 weeks (around 06/07/2017).  Kathryn Calico,  MD

## 2017-05-10 NOTE — Patient Instructions (Signed)

## 2017-05-29 ENCOUNTER — Telehealth: Payer: Self-pay

## 2017-05-29 NOTE — Telephone Encounter (Signed)
Key: [id number]

## 2017-06-05 ENCOUNTER — Telehealth: Payer: Self-pay | Admitting: Internal Medicine

## 2017-06-05 NOTE — Telephone Encounter (Signed)
Patient has dropped off a parking placard to be completed. Forms has been started and placed in providers box to review and sign.

## 2017-06-05 NOTE — Telephone Encounter (Signed)
Forms has been signed, copy sent to scan.   Patient informed it is ready to be picked up.

## 2017-06-06 NOTE — Telephone Encounter (Signed)
PA was denied

## 2017-06-07 NOTE — Telephone Encounter (Signed)
Patient states she called insurance company and insurance will cover script if broken down.  Requesting script to be written seven day supply and then requesting script for the remainder of the 60 tablets.

## 2017-06-08 NOTE — Telephone Encounter (Signed)
Pharmacy contacted and they can only fill for a 7 day. Pharmacy stated that is ready for pick up. The remaining tablets can not be filled.  Pt informed of same.   Pt will need a 30 day supply sent. Next week. Please advise if you are able to send.

## 2017-07-12 ENCOUNTER — Encounter: Payer: Self-pay | Admitting: Family Medicine

## 2017-07-12 ENCOUNTER — Ambulatory Visit: Payer: BLUE CROSS/BLUE SHIELD | Admitting: Family Medicine

## 2017-07-12 VITALS — BP 138/72 | HR 74 | Temp 97.9°F | Ht 65.0 in | Wt 201.0 lb

## 2017-07-12 DIAGNOSIS — J069 Acute upper respiratory infection, unspecified: Secondary | ICD-10-CM

## 2017-07-12 DIAGNOSIS — R42 Dizziness and giddiness: Secondary | ICD-10-CM

## 2017-07-12 DIAGNOSIS — I10 Essential (primary) hypertension: Secondary | ICD-10-CM | POA: Diagnosis not present

## 2017-07-12 MED ORDER — AZITHROMYCIN 250 MG PO TABS: ORAL_TABLET | ORAL | 0 refills | Status: DC

## 2017-07-12 MED ORDER — LISINOPRIL 20 MG PO TABS: 20.0000 mg | ORAL_TABLET | Freq: Every day | ORAL | 0 refills | Status: DC

## 2017-07-12 MED ORDER — MECLIZINE HCL 25 MG PO TABS: 25.0000 mg | ORAL_TABLET | Freq: Three times a day (TID) | ORAL | 0 refills | Status: DC | PRN

## 2017-07-12 NOTE — Progress Notes (Signed)
Kathryn Parks - 57 y.o. female MRN [id number]  Date of birth: 03-29-1960  SUBJECTIVE:  Including CC & ROS.  Chief Complaint  Patient presents with  . Sore Throat    Kathryn Parks is a 57 y.o. female that is presenting with a sore throat and ear pain. Symptoms have been present for two weeks. Denies fevers. Denies productive cough. She has been around family members with same symptoms has been taking Nyquil and Alka seltzer with no improvement.    Review of Systems  Constitutional: Negative for fever.  HENT: Positive for ear pain and sore throat.   Respiratory: Negative for cough.   Cardiovascular: Negative for chest pain.  Gastrointestinal: Negative for abdominal pain.    HISTORY: Past Medical, Surgical, Social, and Family History Reviewed & Updated per EMR.   Pertinent Historical Findings include:  Past Medical History:  Diagnosis Date  . Arthritis   . DDD (degenerative disc disease), lumbar   . Depression   . Hypertension     Past Surgical History:  Procedure Laterality Date  . TUBAL LIGATION  1997    No Known Allergies  Family History  Problem Relation Age of Onset  . Heart disease Father   . Hypertension Father   . Diabetes Father   . Diabetes Mother   . Birth defects Neg Hx   . Cancer Neg Hx   . Alcohol abuse Neg Hx   . Drug abuse Neg Hx   . Early death Neg Hx   . Hyperlipidemia Neg Hx   . Kidney disease Neg Hx   . Stroke Neg Hx   . Colon cancer Neg Hx      Social History   Socioeconomic History  . Marital status: Widowed    Spouse name: Not on file  . Number of children: Not on file  . Years of education: Not on file  . Highest education level: Not on file  Occupational History  . Not on file  Social Needs  . Financial resource strain: Not on file  . Food insecurity:    Worry: Not on file    Inability: Not on file  . Transportation needs:    Medical: Not on file    Non-medical: Not on file  Tobacco Use  . Smoking status: Never Smoker    . Smokeless tobacco: Never Used  Substance and Sexual Activity  . Alcohol use: No    Alcohol/week: 0.0 oz    Comment: Ocassionally  . Drug use: No  . Sexual activity: Not Currently    Partners: Male    Birth control/protection: Surgical    Comment: Tubal Ligation   Lifestyle  . Physical activity:    Days per week: Not on file    Minutes per session: Not on file  . Stress: Not on file  Relationships  . Social connections:    Talks on phone: Not on file    Gets together: Not on file    Attends religious service: Not on file    Active member of club or organization: Not on file    Attends meetings of clubs or organizations: Not on file    Relationship status: Not on file  . Intimate partner violence:    Fear of current or ex partner: Not on file    Emotionally abused: Not on file    Physically abused: Not on file    Forced sexual activity: Not on file  Other Topics Concern  . Not on file  Social History Narrative  .  Not on file     PHYSICAL EXAM:  VS: BP 138/72 (BP Location: Left Arm, Patient Position: Sitting, Cuff Size: Normal)   Pulse 74   Temp 97.9 F (36.6 C) (Oral)   Ht 5\' 5"  (1.651 m)   Wt 201 lb (91.2 kg)   SpO2 100%   BMI 33.45 kg/m  Physical Exam Gen: NAD, alert, cooperative with exam,  ENT: normal lips, normal nasal mucosa, tympanic membranes clear and intact bilaterally, normal oropharynx, no cervical lymphadenopathy Eye: normal EOM, normal conjunctiva and lids CV:  no edema, +2 pedal pulses, regular rate and rhythm, S1-S2   Resp: no accessory muscle use, non-labored, clear to auscultation bilaterally, no crackles or wheezes Skin: no rashes, no areas of induration  Neuro: normal tone, normal sensation to touch Psych:  normal insight, alert and oriented MSK: Normal gait, normal strength       ASSESSMENT & PLAN:   Essential hypertension Refilled med  Vertigo Reports to being chronic in nature.  - refilled med   Upper respiratory tract  infection Possible for viral. Occurring for two weeks  - azithro if no improvement  - counseled on supportive care

## 2017-07-12 NOTE — Patient Instructions (Signed)
Please try things such as zyrtec-D or allegra-D which is an antihistamine and decongestant.   Please try afrin which will help with nasal congestion but use for only three days.   Please also try using a netti pot on a regular occasion.  Honey can help with a sore throat.

## 2017-07-13 DIAGNOSIS — R42 Dizziness and giddiness: Secondary | ICD-10-CM | POA: Insufficient documentation

## 2017-07-13 DIAGNOSIS — J069 Acute upper respiratory infection, unspecified: Secondary | ICD-10-CM | POA: Insufficient documentation

## 2017-07-13 NOTE — Assessment & Plan Note (Signed)
Reports to being chronic in nature.  - refilled med

## 2017-07-13 NOTE — Assessment & Plan Note (Signed)
Refilled med

## 2017-07-13 NOTE — Assessment & Plan Note (Signed)
Possible for viral. Occurring for two weeks  - azithro if no improvement  - counseled on supportive care

## 2017-07-17 ENCOUNTER — Telehealth: Payer: Self-pay | Admitting: *Deleted

## 2017-07-17 NOTE — Telephone Encounter (Signed)
What are her  symptoms?  

## 2017-07-17 NOTE — Telephone Encounter (Deleted)
Runny nose and sneezing. She has not been trying any OTC medications.

## 2017-07-17 NOTE — Telephone Encounter (Signed)
Runny nose, sneezing and sore throat-she has not tried OTC medications.

## 2017-07-17 NOTE — Telephone Encounter (Signed)
Pt is calling back patient stated when she saw dr schmitz on 07-12-17 and if abx does not work he would call her in another abx. [facility] [address]. Pt does not have copay for another visit. Pt would like the nurse to call her back

## 2017-07-17 NOTE — Telephone Encounter (Signed)
Spoke with patient offered to make appointment to see Dr. Raeford Razor patient declined. Offered to make an appointment for her primary care physician, declined she states she wanted something else called in for her symptoms:Runny nose and sneezing. She has not been trying any OTC medications.    Copied from [place] [phone]. Topic: General - Other >> Jul 17, 2017  8:19 AM Yvette Rack wrote: Reason for CRM: patient states that the azithromycin (ZITHROMAX) 250 MG tablet isn't working shes still having drainage sneezing runny eyes sore throat  >> Jul 17, 2017 11:08 AM Cairrikier Dian Queen, CMA wrote: Should pt come back in.

## 2017-07-27 NOTE — Telephone Encounter (Signed)
Pt called back and is asking if Dr. Raeford Razor can call in another medication since the first abx did not work, she is calling back like he asked her to, call pt to advise

## 2017-07-30 NOTE — Telephone Encounter (Signed)
Left patient a message to make an appointment with her primary care provider if she was still symptomatic

## 2017-08-04 ENCOUNTER — Other Ambulatory Visit: Payer: Self-pay | Admitting: Obstetrics

## 2017-08-04 DIAGNOSIS — F419 Anxiety disorder, unspecified: Secondary | ICD-10-CM

## 2017-08-13 ENCOUNTER — Encounter: Payer: Self-pay | Admitting: Family

## 2017-08-13 ENCOUNTER — Ambulatory Visit: Payer: BLUE CROSS/BLUE SHIELD | Admitting: Family

## 2017-08-13 VITALS — BP 132/82 | HR 67 | Temp 98.4°F | Ht 65.0 in | Wt 199.0 lb

## 2017-08-13 DIAGNOSIS — J019 Acute sinusitis, unspecified: Secondary | ICD-10-CM

## 2017-08-13 MED ORDER — CEFDINIR 300 MG PO CAPS: 300.0000 mg | ORAL_CAPSULE | Freq: Two times a day (BID) | ORAL | 0 refills | Status: DC

## 2017-08-13 MED ORDER — PREDNISONE 20 MG PO TABS: 20.0000 mg | ORAL_TABLET | Freq: Every day | ORAL | 0 refills | Status: DC

## 2017-08-13 NOTE — Progress Notes (Signed)
Kathryn Parks is a 57 y.o. female with the following history as recorded in [employer]:  Patient Active Problem List   Diagnosis Date Noted  . Vertigo 07/13/2017  . Upper respiratory tract infection 07/13/2017  . Routine general medical examination at a health care facility 01/05/2016  . Nonintractable episodic headache 01/05/2016  . Sleep apnea 01/05/2016  . Hyperlipidemia with target LDL less than 130 10/06/2013  . Other abnormal glucose 10/06/2013  . Allergic rhinitis, cause unspecified 10/06/2013  . Endometrial hyperplasia, unspecified 04/28/2013  . Obesity 04/28/2013  . Genital herpes, unspecified 02/04/2013  . Essential hypertension 05/30/2012  . Spinal stenosis in cervical region 05/30/2012  . Low back pain radiating to both legs 05/30/2012    Current Outpatient Medications  Medication Sig Dispense Refill  . citalopram (CELEXA) 10 MG tablet TAKE 1 TABLET BY MOUTH EVERY DAY 90 tablet 4  . cyclobenzaprine (FLEXERIL) 5 MG tablet Take 1 tablet (5 mg total) by mouth 3 (three) times daily as needed for muscle spasms. 60 tablet 5  . fexofenadine (ALLEGRA) 180 MG tablet Take 180 mg by mouth daily.    Marland Kitchen ibuprofen (ADVIL,MOTRIN) 800 MG tablet Take 1 tablet (800 mg total) by mouth every 8 (eight) hours as needed. 30 tablet 5  . lisinopril (PRINIVIL,ZESTRIL) 20 MG tablet Take 1 tablet (20 mg total) by mouth daily. 90 tablet 0  . meclizine (ANTIVERT) 25 MG tablet Take 1 tablet (25 mg total) by mouth 3 (three) times daily as needed for dizziness. 30 tablet 0  . oxyCODONE-acetaminophen (PERCOCET) 10-325 MG tablet Take 1 tablet by mouth every 8 (eight) hours as needed for pain. 60 tablet 0  . PREMARIN vaginal cream PLACE 1 G VAGINALLY EVERY OTHER DAY AS NEEDED.  5  . valACYclovir (VALTREX) 1000 MG tablet Take 1 tablet po daily for suppression. 30 tablet 11  . cefdinir (OMNICEF) 300 MG capsule Take 1 capsule (300 mg total) by mouth 2 (two) times daily. 20 capsule 0  . predniSONE (DELTASONE) 20  MG tablet Take 1 tablet (20 mg total) by mouth daily with breakfast. 5 tablet 0   No current facility-administered medications for this visit.     Allergies: Patient has no known allergies.  Past Medical History:  Diagnosis Date  . Arthritis   . DDD (degenerative disc disease), lumbar   . Depression   . Hypertension     Past Surgical History:  Procedure Laterality Date  . TUBAL LIGATION  1997    Family History  Problem Relation Age of Onset  . Heart disease Father   . Hypertension Father   . Diabetes Father   . Diabetes Mother   . Birth defects Neg Hx   . Cancer Neg Hx   . Alcohol abuse Neg Hx   . Drug abuse Neg Hx   . Early death Neg Hx   . Hyperlipidemia Neg Hx   . Kidney disease Neg Hx   . Stroke Neg Hx   . Colon cancer Neg Hx     Social History   Tobacco Use  . Smoking status: Never Smoker  . Smokeless tobacco: Never Used  Substance Use Topics  . Alcohol use: No    Alcohol/week: 0.0 oz    Comment: Ocassionally    Subjective:  Patient was seen in mid-May with URI; was treated with Z-pak with limited benefit; notes that her symptoms have steadily worsened over the past few weeks; + sinus pressure; + dizziness; + ear pain; + headache; currently using OTC Allegra,  Mucinex and Sudafed; no chest pain, no shortness of breath; does have seasonal allergies- has Flonase at home/ not currently using;    Objective:  Vitals:   08/13/17 1339  BP: 132/82  Pulse: 67  Temp: 98.4 F (36.9 C)  TempSrc: Oral  SpO2: 96%  Weight: 199 lb 0.6 oz (90.3 kg)  Height: 5\' 5"  (1.651 m)    General: Well developed, well nourished, in no acute distress  Skin : Warm and dry.  Head: Normocephalic and atraumatic  Eyes: Sclera and conjunctiva clear; pupils round and reactive to light; extraocular movements intact  Ears: External normal; canals clear; tympanic membranes congested bilaterally Oropharynx: Pink, supple. No suspicious lesions  Neck: Supple without thyromegaly, adenopathy   Lungs: Respirations unlabored; clear to auscultation bilaterally without wheeze, rales, rhonchi  CVS exam: normal rate and regular rhythm.  Neurologic: Alert and oriented; speech intact; face symmetrical; moves all extremities well; CNII-XII intact without focal deficit   Assessment:  1. Acute sinusitis, recurrence not specified, unspecified location     Plan:  Rx for Omnicef 300 mg bid x 10 days, prednisone 20 mg qd x 5 days; stressed need to use her Allegra and Flonase regularly; increase fluids, rest and follow-up worse, no better.   No follow-ups on file.  No orders of the defined types were placed in this encounter.   Requested Prescriptions   Signed Prescriptions Disp Refills  . cefdinir (OMNICEF) 300 MG capsule 20 capsule 0    Sig: Take 1 capsule (300 mg total) by mouth 2 (two) times daily.  . predniSONE (DELTASONE) 20 MG tablet 5 tablet 0    Sig: Take 1 tablet (20 mg total) by mouth daily with breakfast.

## 2017-08-23 ENCOUNTER — Other Ambulatory Visit: Payer: Self-pay | Admitting: Internal Medicine

## 2017-08-23 DIAGNOSIS — Z1231 Encounter for screening mammogram for malignant neoplasm of breast: Secondary | ICD-10-CM

## 2017-09-16 ENCOUNTER — Other Ambulatory Visit: Payer: Self-pay | Admitting: Internal Medicine

## 2017-09-16 DIAGNOSIS — M4802 Spinal stenosis, cervical region: Secondary | ICD-10-CM

## 2017-09-17 ENCOUNTER — Ambulatory Visit
Admission: RE | Admit: 2017-09-17 | Discharge: 2017-09-17 | Disposition: A | Discharge status: Home / Self Care | Payer: BLUE CROSS/BLUE SHIELD | Source: Ambulatory Visit | Attending: Internal Medicine | Admitting: Internal Medicine

## 2017-09-17 DIAGNOSIS — Z1231 Encounter for screening mammogram for malignant neoplasm of breast: Secondary | ICD-10-CM

## 2017-09-18 LAB — HM MAMMOGRAPHY

## 2017-12-03 ENCOUNTER — Ambulatory Visit: Payer: BLUE CROSS/BLUE SHIELD | Admitting: Obstetrics

## 2017-12-10 ENCOUNTER — Other Ambulatory Visit (INDEPENDENT_AMBULATORY_CARE_PROVIDER_SITE_OTHER): Payer: BLUE CROSS/BLUE SHIELD

## 2017-12-10 ENCOUNTER — Encounter: Payer: Self-pay | Admitting: Internal Medicine

## 2017-12-10 ENCOUNTER — Ambulatory Visit: Payer: BLUE CROSS/BLUE SHIELD | Admitting: Internal Medicine

## 2017-12-10 VITALS — BP 162/92 | HR 70 | Temp 98.3°F | Resp 16 | Ht 65.0 in | Wt 205.0 lb

## 2017-12-10 DIAGNOSIS — Z0000 Encounter for general adult medical examination without abnormal findings: Secondary | ICD-10-CM

## 2017-12-10 DIAGNOSIS — R51 Headache: Secondary | ICD-10-CM

## 2017-12-10 DIAGNOSIS — G8929 Other chronic pain: Secondary | ICD-10-CM

## 2017-12-10 DIAGNOSIS — I10 Essential (primary) hypertension: Secondary | ICD-10-CM

## 2017-12-10 DIAGNOSIS — Z23 Encounter for immunization: Secondary | ICD-10-CM | POA: Diagnosis not present

## 2017-12-10 DIAGNOSIS — R519 Headache, unspecified: Secondary | ICD-10-CM

## 2017-12-10 DIAGNOSIS — J301 Allergic rhinitis due to pollen: Secondary | ICD-10-CM

## 2017-12-10 DIAGNOSIS — R7309 Other abnormal glucose: Secondary | ICD-10-CM

## 2017-12-10 DIAGNOSIS — E559 Vitamin D deficiency, unspecified: Secondary | ICD-10-CM

## 2017-12-10 DIAGNOSIS — G4730 Sleep apnea, unspecified: Secondary | ICD-10-CM

## 2017-12-10 DIAGNOSIS — F419 Anxiety disorder, unspecified: Secondary | ICD-10-CM | POA: Diagnosis not present

## 2017-12-10 DIAGNOSIS — R102 Pelvic and perineal pain: Secondary | ICD-10-CM | POA: Diagnosis not present

## 2017-12-10 DIAGNOSIS — E785 Hyperlipidemia, unspecified: Secondary | ICD-10-CM

## 2017-12-10 DIAGNOSIS — R42 Dizziness and giddiness: Secondary | ICD-10-CM

## 2017-12-10 DIAGNOSIS — A6004 Herpesviral vulvovaginitis: Secondary | ICD-10-CM

## 2017-12-10 DIAGNOSIS — M4802 Spinal stenosis, cervical region: Secondary | ICD-10-CM | POA: Diagnosis not present

## 2017-12-10 LAB — CBC WITH DIFFERENTIAL/PLATELET
BASOS ABS: 0 10*3/uL (ref 0.0–0.1)
Basophils Relative: 0.4 % (ref 0.0–3.0)
EOS PCT: 1.1 % (ref 0.0–5.0)
Eosinophils Absolute: 0.1 10*3/uL (ref 0.0–0.7)
HCT: 43.7 % (ref 36.0–46.0)
HEMOGLOBIN: 14.7 g/dL (ref 12.0–15.0)
LYMPHS ABS: 2 10*3/uL (ref 0.7–4.0)
LYMPHS PCT: 31.6 % (ref 12.0–46.0)
MCHC: 33.7 g/dL (ref 30.0–36.0)
MCV: 92.4 fl (ref 78.0–100.0)
Monocytes Absolute: 0.4 10*3/uL (ref 0.1–1.0)
Monocytes Relative: 5.8 % (ref 3.0–12.0)
NEUTROS PCT: 61.1 % (ref 43.0–77.0)
Neutro Abs: 3.9 10*3/uL (ref 1.4–7.7)
Platelets: 224 10*3/uL (ref 150.0–400.0)
RBC: 4.73 Mil/uL (ref 3.87–5.11)
RDW: 14.8 % (ref 11.5–15.5)
WBC: 6.4 10*3/uL (ref 4.0–10.5)

## 2017-12-10 LAB — LIPID PANEL
Cholesterol: 190 mg/dL (ref 0–200)
HDL: 48.3 mg/dL (ref >39.00)
LDL CALC: 104 mg/dL — AB (ref 0–99)
NONHDL: 141.59
Total CHOL/HDL Ratio: 4
Triglycerides: 188 mg/dL — ABNORMAL HIGH (ref 0.0–149.0)
VLDL: 37.6 mg/dL (ref 0.0–40.0)

## 2017-12-10 LAB — COMPREHENSIVE METABOLIC PANEL
ALT: 13 U/L (ref 0–35)
AST: 14 U/L (ref 0–37)
Albumin: 4.3 g/dL (ref 3.5–5.2)
Alkaline Phosphatase: 84 U/L (ref 39–117)
BUN: 14 mg/dL (ref 6–23)
CO2: 28 mEq/L (ref 19–32)
Calcium: 9.9 mg/dL (ref 8.4–10.5)
Chloride: 104 mEq/L (ref 96–112)
Creatinine, Ser: 1 mg/dL (ref 0.40–1.20)
GFR: 73.07 mL/min (ref >60.00)
Glucose, Bld: 138 mg/dL — ABNORMAL HIGH (ref 70–99)
Potassium: 3.8 mEq/L (ref 3.5–5.1)
Sodium: 138 mEq/L (ref 135–145)
Total Bilirubin: 0.2 mg/dL (ref 0.2–1.2)
Total Protein: 7.4 g/dL (ref 6.0–8.3)

## 2017-12-10 LAB — VITAMIN D 25 HYDROXY (VIT D DEFICIENCY, FRACTURES): VITD: 14.63 ng/mL — AB (ref 30.00–100.00)

## 2017-12-10 LAB — URINALYSIS, ROUTINE W REFLEX MICROSCOPIC
BILIRUBIN URINE: NEGATIVE
KETONES UR: NEGATIVE
LEUKOCYTES UA: NEGATIVE
NITRITE: NEGATIVE
Specific Gravity, Urine: 1.025 (ref 1.000–1.030)
TOTAL PROTEIN, URINE-UPE24: NEGATIVE
URINE GLUCOSE: NEGATIVE
UROBILINOGEN UA: 0.2 (ref 0.0–1.0)
pH: 6 (ref 5.0–8.0)

## 2017-12-10 LAB — HEMOGLOBIN A1C: Hgb A1c MFr Bld: 6.3 % (ref 4.6–6.5)

## 2017-12-10 LAB — TSH: TSH: 0.63 u[IU]/mL (ref 0.35–4.50)

## 2017-12-10 MED ORDER — MECLIZINE HCL 25 MG PO TABS: 25.0000 mg | ORAL_TABLET | Freq: Three times a day (TID) | ORAL | 0 refills | Status: DC | PRN

## 2017-12-10 MED ORDER — CITALOPRAM HYDROBROMIDE 10 MG PO TABS
10.0000 mg | ORAL_TABLET | Freq: Every day | ORAL | 4 refills | Status: DC
Start: 2017-12-10 — End: 2019-06-02

## 2017-12-10 MED ORDER — VALACYCLOVIR HCL 1 G PO TABS: ORAL_TABLET | ORAL | 11 refills | Status: DC

## 2017-12-10 MED ORDER — FLUTICASONE PROPIONATE 50 MCG/ACT NA SUSP: 2.0000 | Freq: Every day | NASAL | 1 refills | Status: DC

## 2017-12-10 MED ORDER — LISINOPRIL 20 MG PO TABS: 20.0000 mg | ORAL_TABLET | Freq: Every day | ORAL | 0 refills | Status: DC

## 2017-12-10 MED ORDER — CYCLOBENZAPRINE HCL 5 MG PO TABS: 5.0000 mg | ORAL_TABLET | Freq: Three times a day (TID) | ORAL | 5 refills | Status: DC | PRN

## 2017-12-10 MED ORDER — IBUPROFEN 800 MG PO TABS: 800.0000 mg | ORAL_TABLET | Freq: Three times a day (TID) | ORAL | 5 refills | Status: DC | PRN

## 2017-12-10 NOTE — Patient Instructions (Signed)

## 2017-12-10 NOTE — Progress Notes (Signed)
Subjective:  [name], female    DOB: Apr 10, 1960  Age: 57 y.o. MRN: [id number]  CC: Hypertension and Annual Exam   HPI Kathryn Parks presents for a CPX.  She complains of persistent runny nose, nasal congestion, and frontal headache with blurred vision.  She has been using Allegra, Sudafed, and a saline nasal spray.  She has not been using a steroid nasal spray.  She has had an headache off and on for a year and she complains of a 1 year history of numbness in her right lower extremity.  She denies nausea, vomiting, ataxia, or slurred speech.  Outpatient Medications Prior to Visit  Medication Sig Dispense Refill  . fexofenadine (ALLEGRA) 180 MG tablet Take 180 mg by mouth daily.    Marland Kitchen PREMARIN vaginal cream PLACE 1 G VAGINALLY EVERY OTHER DAY AS NEEDED.  5  . cefdinir (OMNICEF) 300 MG capsule Take 1 capsule (300 mg total) by mouth 2 (two) times daily. 20 capsule 0  . citalopram (CELEXA) 10 MG tablet TAKE 1 TABLET BY MOUTH EVERY DAY 90 tablet 4  . cyclobenzaprine (FLEXERIL) 5 MG tablet TAKE 1 TABLET BY MOUTH THREE TIMES A DAY AS NEEDED FOR MUSCLE SPASMS 60 tablet 5  . ibuprofen (ADVIL,MOTRIN) 800 MG tablet Take 1 tablet (800 mg total) by mouth every 8 (eight) hours as needed. 30 tablet 5  . lisinopril (PRINIVIL,ZESTRIL) 20 MG tablet Take 1 tablet (20 mg total) by mouth daily. 90 tablet 0  . meclizine (ANTIVERT) 25 MG tablet Take 1 tablet (25 mg total) by mouth 3 (three) times daily as needed for dizziness. 30 tablet 0  . oxyCODONE-acetaminophen (PERCOCET) 10-325 MG tablet Take 1 tablet by mouth every 8 (eight) hours as needed for pain. 60 tablet 0  . valACYclovir (VALTREX) 1000 MG tablet Take 1 tablet po daily for suppression. 30 tablet 11  . predniSONE (DELTASONE) 20 MG tablet Take 1 tablet (20 mg total) by mouth daily with breakfast. 5 tablet 0   No facility-administered medications prior to visit.     ROS Review of Systems  Constitutional: Negative.  Negative for  appetite change, diaphoresis, fatigue, fever and unexpected weight change.  HENT: Positive for congestion, postnasal drip and rhinorrhea. Negative for facial swelling, hearing loss, nosebleeds, sinus pressure, sore throat, trouble swallowing and voice change.   Eyes: Negative.   Respiratory: Positive for apnea. Negative for cough, shortness of breath and wheezing.        She complains of heavy snoring.  Cardiovascular: Negative for chest pain, palpitations and leg swelling.  Gastrointestinal: Negative for abdominal pain, constipation, diarrhea, nausea and vomiting.  Endocrine: Negative.   Genitourinary: Negative.  Negative for difficulty urinating, dyspareunia, dysuria and hematuria.  Musculoskeletal: Positive for neck pain. Negative for arthralgias, back pain and myalgias.  Skin: Negative.  Negative for color change, pallor and rash.  Neurological: Positive for dizziness, numbness (RLE) and headaches. Negative for tremors, seizures, syncope, facial asymmetry, speech difficulty, weakness and light-headedness.  Hematological: Negative for adenopathy. Does not bruise/bleed easily.  Psychiatric/Behavioral: Positive for dysphoric mood and sleep disturbance. Negative for agitation, behavioral problems, confusion, decreased concentration, hallucinations, self-injury and suicidal ideas. The patient is nervous/anxious. The patient is not hyperactive.     Objective:  BP (!) 162/92 (BP Location: Left Arm, Patient Position: Sitting, Cuff Size: Large)   Pulse 70   Temp 98.3 F (36.8 C) (Oral)   Resp 16   Ht 5\' 5"  (1.651 m)   Wt 205 lb (93 kg)  SpO2 99%   BMI 34.11 kg/m   BP Readings from Last 3 Encounters:  12/10/17 (!) 162/92  08/13/17 132/82  07/12/17 138/72    Wt Readings from Last 3 Encounters:  12/10/17 205 lb (93 kg)  08/13/17 199 lb 0.6 oz (90.3 kg)  07/12/17 201 lb (91.2 kg)    Physical Exam  Constitutional: She is oriented to person, place, and time.  Non-toxic appearance. She  does not have a sickly appearance. She does not appear ill. No distress.  HENT:  Nose: Mucosal edema present. No rhinorrhea. No epistaxis. Right sinus exhibits no maxillary sinus tenderness and no frontal sinus tenderness. Left sinus exhibits no maxillary sinus tenderness and no frontal sinus tenderness.  Mouth/Throat: Oropharynx is clear and moist. No oropharyngeal exudate.  Eyes: Conjunctivae are normal. No scleral icterus.  Neck: Normal range of motion. Neck supple. No JVD present. No thyromegaly present.  Cardiovascular: Normal rate and normal heart sounds. Exam reveals no gallop and no friction rub.  No murmur heard. EKG ---  Sinus  Rhythm  -  Nonspecific T-abnormality.   ABNORMAL - no old EKG for comparison  Pulmonary/Chest: Effort normal and breath sounds normal. No respiratory distress. She has no wheezes. She has no rales.  Abdominal: Soft. Bowel sounds are normal. She exhibits no mass. There is no hepatosplenomegaly. There is no tenderness.  Musculoskeletal: Normal range of motion. She exhibits no edema, tenderness or deformity.  Lymphadenopathy:    She has no cervical adenopathy.  Neurological: She is alert and oriented to person, place, and time. She displays normal reflexes. No cranial nerve deficit or sensory deficit. She exhibits normal muscle tone. Coordination normal.  Skin: Skin is warm and dry. She is not diaphoretic. No pallor.  Psychiatric: Her behavior is normal. Judgment and thought content normal.  Vitals reviewed.   Lab Results  Component Value Date   WBC 6.4 12/10/2017   HGB 14.7 12/10/2017   HCT 43.7 12/10/2017   PLT 224.0 12/10/2017   GLUCOSE 138 (H) 12/10/2017   CHOL 190 12/10/2017   TRIG 188.0 (H) 12/10/2017   HDL 48.30 12/10/2017   LDLCALC 104 (H) 12/10/2017   ALT 13 12/10/2017   AST 14 12/10/2017   NA 138 12/10/2017   K 3.8 12/10/2017   CL 104 12/10/2017   CREATININE 1.00 12/10/2017   BUN 14 12/10/2017   CO2 28 12/10/2017   TSH 0.63  12/10/2017   HGBA1C 6.3 12/10/2017    Mm Digital Screening Bilateral  Result Date: 09/18/2017 CLINICAL DATA:  Screening. EXAM: DIGITAL SCREENING BILATERAL MAMMOGRAM WITH CAD COMPARISON:  Previous exam(s). ACR Breast Density Category b: There are scattered areas of fibroglandular density. FINDINGS: There are no findings suspicious for malignancy. Images were processed with CAD. IMPRESSION: No mammographic evidence of malignancy. A result letter of this screening mammogram will be mailed directly to the patient. RECOMMENDATION: Screening mammogram in one year. (Code:SM-B-01Y) BI-RADS CATEGORY  1: Negative. Electronically Signed   By: Evangeline Dakin M.D.   On: 09/18/2017 11:40    Assessment & Plan:   Kathryn Parks was seen today for hypertension and annual exam.  Diagnoses and all orders for this visit:  Need for influenza vaccination -     Flu Vaccine QUAD 36+ mos IM  Anxiety -     citalopram (CELEXA) 10 MG tablet; Take 1 tablet (10 mg total) by mouth daily.  Spinal stenosis in cervical region -     cyclobenzaprine (FLEXERIL) 5 MG tablet; Take 1 tablet (5 mg total)  by mouth 3 (three) times daily as needed for muscle spasms.  Chronic pelvic pain in female -     Discontinue: ibuprofen (ADVIL,MOTRIN) 800 MG tablet; Take 1 tablet (800 mg total) by mouth every 8 (eight) hours as needed.  Essential hypertension- Her blood pressure is not well controlled on the ACE inhibitor so I have asked her to stop taking it.  I will treat the vitamin D deficiency.  Her EKG is negative for LVH or ischemia.  Her labs are otherwise negative for secondary causes or endorgan damage.  I have asked her to start taking a combination of triamterene and hydrochlorothiazide for blood pressure control.  I have also told her to stop taking decongestants. -     Discontinue: lisinopril (PRINIVIL,ZESTRIL) 20 MG tablet; Take 1 tablet (20 mg total) by mouth daily. -     CBC with Differential/Platelet; Future -     Comprehensive  metabolic panel; Future -     TSH; Future -     Urinalysis, Routine w reflex microscopic; Future -     VITAMIN D 25 Hydroxy (Vit-D Deficiency, Fractures); Future -     EKG 12-Lead -     triamterene-hydrochlorothiazide (DYAZIDE) 37.5-25 MG capsule; Take 1 each (1 capsule total) by mouth daily.  Vertigo- Will continue meclizine as needed for symptom relief.  I have ordered an MRI of her brain to screen for mass, tumor, or CVAs. -     meclizine (ANTIVERT) 25 MG tablet; Take 1 tablet (25 mg total) by mouth 3 (three) times daily as needed for dizziness. -     MR Brain Wo Contrast; Future  Herpes simplex vulvovaginitis -     valACYclovir (VALTREX) 1000 MG tablet; Take 1 tablet po daily for suppression.  Sleep apnea, unspecified type -     Ambulatory referral to Sleep Studies  Seasonal allergic rhinitis due to pollen -     fluticasone (FLONASE) 50 MCG/ACT nasal spray; Place 2 sprays into both nostrils daily.  Hyperlipidemia with target LDL less than 130- Her ASCVD risk score is less than 15% so I do not recommend a statin for CV risk reduction at this time.  Routine general medical examination at a health care facility- Exam completed, labs reviewed, vaccines reviewed and updated, screening for colon cancer/PAP/mammogram are all up-to-date. -     Lipid panel; Future  Other abnormal glucose- Her A1c is at 6.3%.  She is prediabetic.  Medical therapy is not indicated. -     Hemoglobin A1c; Future  Intractable episodic headache, unspecified headache type- I have ordered an MRI of the brain to see if there is concern for tumor, bleeding, mass, or CVA. -     MR Brain Wo Contrast; Future  Vitamin D deficiency disease -     Cholecalciferol 50000 units capsule; Take 1 capsule (50,000 Units total) by mouth once a week.   I have discontinued Kathryn Parks's ibuprofen, oxyCODONE-acetaminophen, lisinopril, cefdinir, predniSONE, ibuprofen, and lisinopril. I have also changed her citalopram and  cyclobenzaprine. Additionally, I am having her start on fluticasone, Cholecalciferol, and triamterene-hydrochlorothiazide. Lastly, I am having her maintain her PREMARIN, fexofenadine, meclizine, and valACYclovir.  Meds ordered this encounter  Medications  . citalopram (CELEXA) 10 MG tablet    Sig: Take 1 tablet (10 mg total) by mouth daily.    Dispense:  90 tablet    Refill:  4  . cyclobenzaprine (FLEXERIL) 5 MG tablet    Sig: Take 1 tablet (5 mg total) by mouth 3 (  three) times daily as needed for muscle spasms.    Dispense:  60 tablet    Refill:  5  . DISCONTD: ibuprofen (ADVIL,MOTRIN) 800 MG tablet    Sig: Take 1 tablet (800 mg total) by mouth every 8 (eight) hours as needed.    Dispense:  30 tablet    Refill:  5  . DISCONTD: lisinopril (PRINIVIL,ZESTRIL) 20 MG tablet    Sig: Take 1 tablet (20 mg total) by mouth daily.    Dispense:  90 tablet    Refill:  0  . meclizine (ANTIVERT) 25 MG tablet    Sig: Take 1 tablet (25 mg total) by mouth 3 (three) times daily as needed for dizziness.    Dispense:  30 tablet    Refill:  0  . valACYclovir (VALTREX) 1000 MG tablet    Sig: Take 1 tablet po daily for suppression.    Dispense:  30 tablet    Refill:  11  . fluticasone (FLONASE) 50 MCG/ACT nasal spray    Sig: Place 2 sprays into both nostrils daily.    Dispense:  48 g    Refill:  1  . Cholecalciferol 50000 units capsule    Sig: Take 1 capsule (50,000 Units total) by mouth once a week.    Dispense:  12 capsule    Refill:  1  . triamterene-hydrochlorothiazide (DYAZIDE) 37.5-25 MG capsule    Sig: Take 1 each (1 capsule total) by mouth daily.    Dispense:  90 capsule    Refill:  0     Follow-up: Return in about 6 weeks (around 01/21/2018).  Kathryn Calico, MD

## 2017-12-11 ENCOUNTER — Encounter: Payer: Self-pay | Admitting: Internal Medicine

## 2017-12-11 ENCOUNTER — Ambulatory Visit (HOSPITAL_COMMUNITY)
Admission: EM | Admit: 2017-12-11 | Discharge: 2017-12-11 | Disposition: A | Discharge status: Home / Self Care | Payer: BLUE CROSS/BLUE SHIELD | Attending: Family Medicine | Admitting: Family Medicine

## 2017-12-11 ENCOUNTER — Encounter (HOSPITAL_COMMUNITY): Payer: Self-pay | Admitting: Emergency Medicine

## 2017-12-11 DIAGNOSIS — M542 Cervicalgia: Secondary | ICD-10-CM

## 2017-12-11 DIAGNOSIS — M545 Low back pain, unspecified: Secondary | ICD-10-CM

## 2017-12-11 DIAGNOSIS — E559 Vitamin D deficiency, unspecified: Secondary | ICD-10-CM | POA: Insufficient documentation

## 2017-12-11 MED ORDER — TRIAMTERENE-HCTZ 37.5-25 MG PO CAPS: 1.0000 | ORAL_CAPSULE | Freq: Every day | ORAL | 0 refills | Status: DC

## 2017-12-11 MED ORDER — MELOXICAM 7.5 MG PO TABS: 7.5000 mg | ORAL_TABLET | Freq: Every day | ORAL | 0 refills | Status: DC

## 2017-12-11 MED ORDER — CHOLECALCIFEROL 1.25 MG (50000 UT) PO CAPS: 50000.0000 [IU] | ORAL_CAPSULE | ORAL | 1 refills | Status: DC
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
## 2017-12-12 ENCOUNTER — Ambulatory Visit (INDEPENDENT_AMBULATORY_CARE_PROVIDER_SITE_OTHER)
Admission: RE | Admit: 2017-12-12 | Discharge: 2017-12-12 | Disposition: A | Discharge status: Home / Self Care | Payer: BLUE CROSS/BLUE SHIELD | Source: Ambulatory Visit | Attending: Family | Admitting: Family

## 2017-12-12 ENCOUNTER — Ambulatory Visit: Payer: BLUE CROSS/BLUE SHIELD | Admitting: Family

## 2017-12-12 ENCOUNTER — Encounter: Payer: Self-pay | Admitting: Family

## 2017-12-12 VITALS — BP 142/82 | HR 87 | Temp 97.9°F | Ht 65.0 in

## 2017-12-12 DIAGNOSIS — M542 Cervicalgia: Secondary | ICD-10-CM | POA: Diagnosis not present

## 2017-12-12 MED ORDER — PREDNISONE 20 MG PO TABS: 40.0000 mg | ORAL_TABLET | Freq: Every day | ORAL | 0 refills | Status: DC
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
## 2017-12-13 ENCOUNTER — Telehealth: Payer: Self-pay

## 2017-12-13 NOTE — Progress Notes (Signed)
I called patient as requested; she did not answer phone/ had to leave voicemail.
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
## 2017-12-13 NOTE — Telephone Encounter (Signed)
Copied from [place] [phone]. Topic: General - Other >> Dec 13, 2017  8:46 AM Carolyn Stare wrote:  Pt call to say she want Jodi Mourning to call her back about her xray she said she spoke to the nurse yesterday and mention this to her about the call back    [phone]

## 2017-12-17 ENCOUNTER — Ambulatory Visit: Payer: BLUE CROSS/BLUE SHIELD | Admitting: Obstetrics

## 2017-12-28 ENCOUNTER — Telehealth: Payer: Self-pay

## 2017-12-28 ENCOUNTER — Encounter: Payer: Self-pay | Admitting: Family

## 2017-12-28 NOTE — Telephone Encounter (Signed)
Note is on your desk

## 2017-12-28 NOTE — Telephone Encounter (Signed)
Copied from [place] [phone]. Topic: General - Other >> Dec 28, 2017  9:08 AM Leward Quan A wrote: Reason for CRM: Patient request a call back from Suella Broad said she have some questions. Please advise Ph# [phone] >> Dec 28, 2017  9:18 AM Peace, Tammy L wrote: Im assuming Valere Dross.

## 2017-12-28 NOTE — Telephone Encounter (Signed)
Called and left message for patient that letter was ready for pick up.

## 2018-01-21 ENCOUNTER — Ambulatory Visit: Payer: BLUE CROSS/BLUE SHIELD | Admitting: Internal Medicine

## 2018-01-21 ENCOUNTER — Encounter: Payer: Self-pay | Admitting: Internal Medicine

## 2018-01-21 DIAGNOSIS — I10 Essential (primary) hypertension: Secondary | ICD-10-CM | POA: Diagnosis not present

## 2018-01-21 MED ORDER — TRIAMTERENE-HCTZ 37.5-25 MG PO CAPS: 1.0000 | ORAL_CAPSULE | Freq: Every day | ORAL | 0 refills | Status: DC
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
## 2018-01-21 NOTE — Patient Instructions (Signed)

## 2018-01-30 ENCOUNTER — Encounter: Payer: Self-pay | Admitting: Internal Medicine

## 2018-03-08 ENCOUNTER — Other Ambulatory Visit: Payer: Self-pay | Admitting: Internal Medicine

## 2018-03-08 DIAGNOSIS — I10 Essential (primary) hypertension: Secondary | ICD-10-CM

## 2018-05-03 ENCOUNTER — Telehealth: Payer: Self-pay | Admitting: Internal Medicine

## 2018-05-03 DIAGNOSIS — I10 Essential (primary) hypertension: Secondary | ICD-10-CM

## 2018-05-03 NOTE — Telephone Encounter (Signed)
Copied from [place] [id number]. Topic: Quick Communication - See Telephone Encounter >> May 03, 2018  8:48 AM Blase Mess A wrote: CRM for notification. See Telephone encounter for: 05/03/18.  Patient is calling because the triamterene-hydrochlorothiazide (DYAZIDE) 37.5-25 MG capsule [[id number]] - it is causing her to have a stiff neck and headaches.  The patient is requesting the non generic medication. Please advise. [id number] [address], [place] [phone] (Phone) [phone] (Fax)

## 2018-05-03 NOTE — Telephone Encounter (Signed)
Pt called back, she wants the name brand of the med.  The Generic is giving her a stiff neck. She just wants the  brand name called.  She stated that her ins told her they will cover it if Dr Ronnald Ramp will call it in. She said that the pill just looked different.  She stated it had the same name on the bottle.  She was going to go this week and talk to the pharmaist and see what the difference is

## 2018-05-03 NOTE — Telephone Encounter (Signed)
LVM for pt to call back as soon as possible.   RE: She will need an appt to address the side effects

## 2018-05-05 ENCOUNTER — Other Ambulatory Visit: Payer: Self-pay | Admitting: Internal Medicine

## 2018-05-05 DIAGNOSIS — E559 Vitamin D deficiency, unspecified: Secondary | ICD-10-CM

## 2018-05-06 MED ORDER — DYAZIDE 37.5-25 MG PO CAPS: 1.0000 | ORAL_CAPSULE | Freq: Every day | ORAL | 1 refills | Status: DC

## 2018-05-06 NOTE — Telephone Encounter (Signed)
Sent erx for name brand.   Will start PA.   Key: [facility]

## 2018-05-07 ENCOUNTER — Telehealth: Payer: Self-pay | Admitting: Internal Medicine

## 2018-05-07 NOTE — Telephone Encounter (Signed)
The PA for Dyazide was denied.   Formulary alternative that need to be tried and failed are spironolactone/hydrochlorothiazide, Eplerenone, Amiloride/hctz.  Will call patient to see what she would like to do.

## 2018-05-07 NOTE — Telephone Encounter (Signed)
Copied from [place] [id number]. Topic: Quick Communication - Rx Refill/Question >> May 07, 2018  9:09 AM Alanda Slim E wrote: Medication: DYAZIDE 37.5-25 MG capsule   [name] From [place] called to advised that the PA for the above medication was DENIED. The medication is not covered by the plan and they want her to try Hydrochlorothiazide or Eplerenone first before they will approve. / please advise

## 2018-05-07 NOTE — Telephone Encounter (Signed)
Called mobile number listed for patient. LVM for pt to call back so we may discuss the alternatives and how she would like proceed.

## 2018-05-07 NOTE — Telephone Encounter (Signed)
Duplicate note. I have already contacted the pt and informed of same.

## 2018-05-13 ENCOUNTER — Encounter: Payer: BLUE CROSS/BLUE SHIELD | Admitting: Internal Medicine

## 2018-05-13 ENCOUNTER — Other Ambulatory Visit: Payer: Self-pay

## 2018-07-19 ENCOUNTER — Other Ambulatory Visit: Payer: Self-pay | Admitting: Internal Medicine

## 2018-07-19 DIAGNOSIS — I10 Essential (primary) hypertension: Secondary | ICD-10-CM

## 2018-07-31 ENCOUNTER — Other Ambulatory Visit: Payer: Self-pay | Admitting: Internal Medicine

## 2018-07-31 DIAGNOSIS — E559 Vitamin D deficiency, unspecified: Secondary | ICD-10-CM

## 2018-10-13 ENCOUNTER — Other Ambulatory Visit: Payer: Self-pay | Admitting: Internal Medicine

## 2018-10-13 DIAGNOSIS — I10 Essential (primary) hypertension: Secondary | ICD-10-CM

## 2018-10-15 ENCOUNTER — Other Ambulatory Visit: Payer: Self-pay | Admitting: Internal Medicine

## 2018-10-15 DIAGNOSIS — E559 Vitamin D deficiency, unspecified: Secondary | ICD-10-CM

## 2018-11-09 ENCOUNTER — Other Ambulatory Visit: Payer: Self-pay | Admitting: Internal Medicine

## 2018-11-09 DIAGNOSIS — I10 Essential (primary) hypertension: Secondary | ICD-10-CM

## 2018-11-22 DIAGNOSIS — D251 Intramural leiomyoma of uterus: Secondary | ICD-10-CM | POA: Insufficient documentation

## 2018-11-22 DIAGNOSIS — D250 Submucous leiomyoma of uterus: Secondary | ICD-10-CM | POA: Insufficient documentation

## 2018-12-23 ENCOUNTER — Other Ambulatory Visit: Payer: Self-pay | Admitting: Internal Medicine

## 2018-12-23 DIAGNOSIS — J301 Allergic rhinitis due to pollen: Secondary | ICD-10-CM

## 2019-03-19 ENCOUNTER — Ambulatory Visit: Payer: BC Managed Care – PPO | Attending: Internal Medicine

## 2019-03-19 DIAGNOSIS — Z20822 Contact with and (suspected) exposure to covid-19: Secondary | ICD-10-CM

## 2019-03-20 LAB — NOVEL CORONAVIRUS, NAA: SARS-CoV-2, NAA: NOT DETECTED

## 2019-03-25 ENCOUNTER — Ambulatory Visit: Payer: BC Managed Care – PPO | Attending: Internal Medicine

## 2019-03-25 DIAGNOSIS — Z20822 Contact with and (suspected) exposure to covid-19: Secondary | ICD-10-CM

## 2019-03-26 LAB — NOVEL CORONAVIRUS, NAA: SARS-CoV-2, NAA: NOT DETECTED

## 2019-06-02 ENCOUNTER — Encounter: Payer: Self-pay | Admitting: Internal Medicine

## 2019-06-02 ENCOUNTER — Ambulatory Visit (INDEPENDENT_AMBULATORY_CARE_PROVIDER_SITE_OTHER): Payer: 59 | Admitting: Internal Medicine

## 2019-06-02 ENCOUNTER — Ambulatory Visit (INDEPENDENT_AMBULATORY_CARE_PROVIDER_SITE_OTHER): Payer: 59

## 2019-06-02 ENCOUNTER — Other Ambulatory Visit: Payer: Self-pay

## 2019-06-02 VITALS — BP 150/84 | HR 71 | Temp 98.0°F | Ht 65.0 in | Wt 203.0 lb

## 2019-06-02 DIAGNOSIS — Z0000 Encounter for general adult medical examination without abnormal findings: Secondary | ICD-10-CM

## 2019-06-02 DIAGNOSIS — R0789 Other chest pain: Secondary | ICD-10-CM

## 2019-06-02 DIAGNOSIS — E785 Hyperlipidemia, unspecified: Secondary | ICD-10-CM

## 2019-06-02 DIAGNOSIS — R7303 Prediabetes: Secondary | ICD-10-CM

## 2019-06-02 DIAGNOSIS — K2100 Gastro-esophageal reflux disease with esophagitis, without bleeding: Secondary | ICD-10-CM | POA: Insufficient documentation

## 2019-06-02 DIAGNOSIS — I10 Essential (primary) hypertension: Secondary | ICD-10-CM | POA: Diagnosis not present

## 2019-06-02 DIAGNOSIS — Z1231 Encounter for screening mammogram for malignant neoplasm of breast: Secondary | ICD-10-CM

## 2019-06-02 DIAGNOSIS — E559 Vitamin D deficiency, unspecified: Secondary | ICD-10-CM

## 2019-06-02 LAB — CBC WITH DIFFERENTIAL/PLATELET
Basophils Absolute: 0 10*3/uL (ref 0.0–0.1)
Basophils Relative: 0.3 % (ref 0.0–3.0)
Eosinophils Absolute: 0.1 10*3/uL (ref 0.0–0.7)
Eosinophils Relative: 0.9 % (ref 0.0–5.0)
HCT: 42.3 % (ref 36.0–46.0)
Hemoglobin: 14.1 g/dL (ref 12.0–15.0)
Lymphocytes Relative: 39.6 % (ref 12.0–46.0)
Lymphs Abs: 2.6 10*3/uL (ref 0.7–4.0)
MCHC: 33.3 g/dL (ref 30.0–36.0)
MCV: 93.8 fl (ref 78.0–100.0)
Monocytes Absolute: 0.5 10*3/uL (ref 0.1–1.0)
Monocytes Relative: 7.1 % (ref 3.0–12.0)
Neutro Abs: 3.4 10*3/uL (ref 1.4–7.7)
Neutrophils Relative %: 52.1 % (ref 43.0–77.0)
Platelets: 227 10*3/uL (ref 150.0–400.0)
RBC: 4.5 Mil/uL (ref 3.87–5.11)
RDW: 15.2 % (ref 11.5–15.5)
WBC: 6.6 10*3/uL (ref 4.0–10.5)

## 2019-06-02 LAB — BASIC METABOLIC PANEL
BUN: 9 mg/dL (ref 6–23)
CO2: 32 mEq/L (ref 19–32)
Calcium: 10 mg/dL (ref 8.4–10.5)
Chloride: 103 mEq/L (ref 96–112)
Creatinine, Ser: 0.97 mg/dL (ref 0.40–1.20)
GFR: 70.85 mL/min (ref >60.00)
Glucose, Bld: 102 mg/dL — ABNORMAL HIGH (ref 70–99)
Potassium: 3.6 mEq/L (ref 3.5–5.1)
Sodium: 141 mEq/L (ref 135–145)

## 2019-06-02 LAB — VITAMIN D 25 HYDROXY (VIT D DEFICIENCY, FRACTURES): VITD: 28.8 ng/mL — ABNORMAL LOW (ref 30.00–100.00)

## 2019-06-02 LAB — LIPID PANEL
Cholesterol: 195 mg/dL (ref 0–200)
HDL: 55 mg/dL (ref >39.00)
LDL Cholesterol: 108 mg/dL — ABNORMAL HIGH (ref 0–99)
NonHDL: 139.5
Total CHOL/HDL Ratio: 4
Triglycerides: 157 mg/dL — ABNORMAL HIGH (ref 0.0–149.0)
VLDL: 31.4 mg/dL (ref 0.0–40.0)

## 2019-06-02 LAB — HEPATIC FUNCTION PANEL
ALT: 15 U/L (ref 0–35)
AST: 17 U/L (ref 0–37)
Albumin: 4.5 g/dL (ref 3.5–5.2)
Alkaline Phosphatase: 86 U/L (ref 39–117)
Bilirubin, Direct: 0.1 mg/dL (ref 0.0–0.3)
Total Bilirubin: 0.3 mg/dL (ref 0.2–1.2)
Total Protein: 7.5 g/dL (ref 6.0–8.3)

## 2019-06-02 LAB — D-DIMER, QUANTITATIVE: D-Dimer, Quant: <0.19 mcg/mL FEU (ref <0.50)

## 2019-06-02 LAB — TROPONIN I (HIGH SENSITIVITY): High Sens Troponin I: 3 ng/L (ref 2–17)

## 2019-06-02 LAB — TSH: TSH: 0.87 u[IU]/mL (ref 0.35–4.50)

## 2019-06-02 LAB — HEMOGLOBIN A1C: Hgb A1c MFr Bld: 6.1 % (ref 4.6–6.5)

## 2019-06-02 MED ORDER — INDAPAMIDE 1.25 MG PO TABS: 1.2500 mg | ORAL_TABLET | Freq: Every day | ORAL | 0 refills | Status: DC

## 2019-06-02 MED ORDER — ESOMEPRAZOLE MAGNESIUM 40 MG PO CPDR: 40.0000 mg | DELAYED_RELEASE_CAPSULE | Freq: Every day | ORAL | 1 refills | Status: DC

## 2019-06-02 NOTE — Patient Instructions (Signed)
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
## 2019-06-03 ENCOUNTER — Encounter: Payer: Self-pay | Admitting: Internal Medicine

## 2019-06-03 ENCOUNTER — Other Ambulatory Visit: Payer: Self-pay | Admitting: Internal Medicine

## 2019-06-03 DIAGNOSIS — Z1231 Encounter for screening mammogram for malignant neoplasm of breast: Secondary | ICD-10-CM

## 2019-06-03 LAB — URINALYSIS, ROUTINE W REFLEX MICROSCOPIC
Bilirubin Urine: NEGATIVE
Ketones, ur: NEGATIVE
Leukocytes,Ua: NEGATIVE
Nitrite: NEGATIVE
Specific Gravity, Urine: 1.02 (ref 1.000–1.030)
Total Protein, Urine: NEGATIVE
Urine Glucose: NEGATIVE
Urobilinogen, UA: 0.2 (ref 0.0–1.0)
pH: 6.5 (ref 5.0–8.0)

## 2019-06-03 MED ORDER — ATORVASTATIN CALCIUM 10 MG PO TABS: 10.0000 mg | ORAL_TABLET | Freq: Every day | ORAL | 1 refills | Status: DC

## 2019-06-03 MED ORDER — CHOLECALCIFEROL 50 MCG (2000 UT) PO TABS: 1.0000 | ORAL_TABLET | Freq: Every day | ORAL | 3 refills | Status: DC

## 2019-08-04 ENCOUNTER — Ambulatory Visit: Payer: 59

## 2019-08-28 ENCOUNTER — Encounter: Payer: Self-pay | Admitting: Internal Medicine

## 2019-09-02 ENCOUNTER — Other Ambulatory Visit: Payer: Self-pay | Admitting: Internal Medicine

## 2019-09-02 DIAGNOSIS — K2100 Gastro-esophageal reflux disease with esophagitis, without bleeding: Secondary | ICD-10-CM

## 2019-09-02 DIAGNOSIS — I10 Essential (primary) hypertension: Secondary | ICD-10-CM

## 2019-09-02 DIAGNOSIS — E785 Hyperlipidemia, unspecified: Secondary | ICD-10-CM

## 2019-10-01 ENCOUNTER — Other Ambulatory Visit: Payer: Self-pay | Admitting: Obstetrics

## 2019-10-17 NOTE — Patient Instructions (Addendum)
YOU ARE SCHEDULED FOR A COVID TEST 10-24-19@ 3:10 PM. THIS TEST MUST BE DONE BEFORE SURGERY. GO TO  [facility] [place], [place], IT IS APPROXIMATELY 2 MINUTES PAST [employer] ON THE RIGHT AND REMAIN IN YOUR CAR, THIS IS A DRIVE UP TEST. ONCE YOUR COVID TEST IS DONE PLEASE FOLLOW ALL THE QUARANTINE  INSTRUCTIONS GIVEN IN YOUR HANDOUT.      Your procedure is scheduled on 10-28-19   Report to [facility] AT  11:00 A.M.   Call this number if you have problems the morning of surgery  :[phone].   OUR ADDRESS IS [place].  WE ARE LOCATED IN [facility].  PLEASE BRING YOUR INSURANCE CARD AND PHOTO ID DAY OF SURGERY.  ONLY ONE PERSON ALLOWED IN FACILITY WAITING AREA.                                     REMEMBER:  AFTER MIDNIGHT,YOU MAY HAVE CLEAR LIQUIDS UNTIL 7AM. AFTER 7AM. NOTHING UNTIL AFTER SURGERY.      CLEAR LIQUID DIET   Foods Allowed                                                                     Foods Excluded  Coffee and tea, regular and decaf                             liquids that you cannot  Plain Jell-O any favor except red or purple                                           see through such as: Fruit ices (not with fruit pulp)                                     milk, soups, orange juice  Iced Popsicles                                    All solid food Carbonated beverages, regular and diet                                    Cranberry, grape and apple juices Sports drinks like Gatorade Lightly seasoned clear broth or consume(fat free) Sugar, honey syrup   _____________________________________________________________________       YOU MAY  BRUSH YOUR TEETH MORNING OF SURGERY AND RINSE YOUR MOUTH OUT, NO CHEWING GUM CANDY OR MINTS.   TAKE THESE MEDICATIONS MORNING OF SURGERY WITH A SIP OF WATER:  NONE  ONE VISITOR IS ALLOWED IN WAITING ROOM ONLY DAY OF SURGERY.    NO VISITOR MAY SPEND THE  NIGHT.  VISITOR ARE ALLOWED TO STAY UNTIL 800 PM.  DO NOT WEAR JEWERLY, MAKE UP, OR NAIL POLISH ON FINGERNAILS.  DO NOT WEAR LOTIONS, POWDERS, PERFUMES OR DEODORANT.  DO NOT SHAVE FOR 24 HOURS PRIOR TO DAY OF SURGERY.  CONTACTS, GLASSES, OR DENTURES MAY NOT BE WORN TO SURGERY.                                    [facility] IS NOT RESPONSIBLE  FOR ANY BELONGINGS.                                                                    Marland Kitchen                                                                                                     - Preparing for Surgery Before surgery, you can play an important role.  Because skin is not sterile, your skin needs to be as free of germs as possible.  You can reduce the number of germs on your skin by washing with CHG (chlorahexidine gluconate) soap before surgery.  CHG is an antiseptic cleaner which kills germs and bonds with the skin to continue killing germs even after washing. Please DO NOT use if you have an allergy to CHG or antibacterial soaps.  If your skin becomes reddened/irritated stop using the CHG and inform your nurse when you arrive at Short Stay. Do not shave (including legs and underarms) for at least 48 hours prior to the first CHG shower.  You may shave your face/neck. Please follow these instructions carefully:  1.  Shower with CHG Soap the night before surgery and the  morning of Surgery.  2.  If you choose to wash your hair, wash your hair first as usual with your  normal  shampoo.  3.  After you shampoo, rinse your hair and body thoroughly to remove the  shampoo.                           4.  Use CHG as you would any other liquid soap.  You can apply chg directly  to the skin and wash                       Gently with a scrungie or clean washcloth.  5.  Apply the CHG Soap to your body ONLY FROM THE NECK DOWN.   Do not use on face/ open                           Wound or open sores. Avoid contact  with eyes, ears mouth and genitals (private parts).  Wash face,  Genitals (private parts) with your normal soap.             6.  Wash thoroughly, paying special attention to the area where your surgery  will be performed.  7.  Thoroughly rinse your body with warm water from the neck down.  8.  DO NOT shower/wash with your normal soap after using and rinsing off  the CHG Soap.                9.  Pat yourself dry with a clean towel.            10.  Wear clean pajamas.            11.  Place clean sheets on your bed the night of your first shower and do not  sleep with pets. Day of Surgery : Do not apply any lotions/deodorants the morning of surgery.  Please wear clean clothes to the hospital/surgery center.  FAILURE TO FOLLOW THESE INSTRUCTIONS MAY RESULT IN THE CANCELLATION OF YOUR SURGERY PATIENT SIGNATURE_________________________________  NURSE SIGNATURE__________________________________  ________________________________________________________________________

## 2019-10-17 NOTE — Progress Notes (Addendum)
COVID Vaccine Completed: Date COVID Vaccine completed: COVID vaccine manufacturer: [place]   PCP - Janith Lima, MD Cardiologist -   Chest x-ray - 06-02-19  EKG -06-26-19  Stress Test -  ECHO -  Cardiac Cath -   Sleep Study -  CPAP -   Fasting Blood Sugar -  Checks Blood Sugar _____ times a day  Blood Thinner Instructions: Aspirin Instructions: Last Dose:  Anesthesia review:   Patient denies shortness of breath, fever, cough and chest pain at PAT appointment   Patient verbalized understanding of instructions that were given to them at the PAT appointment. Patient was also instructed that they will need to review over the PAT instructions again at home before surgery.

## 2019-10-20 ENCOUNTER — Encounter (HOSPITAL_COMMUNITY): Payer: Self-pay

## 2019-10-20 ENCOUNTER — Other Ambulatory Visit: Payer: Self-pay

## 2019-10-20 ENCOUNTER — Encounter (HOSPITAL_COMMUNITY)
Admission: RE | Admit: 2019-10-20 | Discharge: 2019-10-20 | Disposition: A | Discharge status: Home / Self Care | Payer: 59 | Source: Ambulatory Visit | Attending: Obstetrics | Admitting: Obstetrics

## 2019-10-20 DIAGNOSIS — Z01812 Encounter for preprocedural laboratory examination: Secondary | ICD-10-CM | POA: Insufficient documentation

## 2019-10-20 HISTORY — DX: Prediabetes: R73.03

## 2019-10-20 LAB — BASIC METABOLIC PANEL
Anion gap: 7 (ref 5–15)
BUN: 12 mg/dL (ref 6–20)
CO2: 28 mmol/L (ref 22–32)
Calcium: 9.5 mg/dL (ref 8.9–10.3)
Chloride: 105 mmol/L (ref 98–111)
Creatinine, Ser: 0.87 mg/dL (ref 0.44–1.00)
GFR calc Af Amer: >60 mL/min (ref >60)
GFR calc non Af Amer: >60 mL/min (ref >60)
Glucose, Bld: 101 mg/dL — ABNORMAL HIGH (ref 70–99)
Potassium: 4.3 mmol/L (ref 3.5–5.1)
Sodium: 140 mmol/L (ref 135–145)

## 2019-10-20 LAB — CBC
HCT: 43.2 % (ref 36.0–46.0)
Hemoglobin: 14.5 g/dL (ref 12.0–15.0)
MCH: 31.3 pg (ref 26.0–34.0)
MCHC: 33.6 g/dL (ref 30.0–36.0)
MCV: 93.3 fL (ref 80.0–100.0)
Platelets: 249 10*3/uL (ref 150–400)
RBC: 4.63 MIL/uL (ref 3.87–5.11)
RDW: 14.7 % (ref 11.5–15.5)
WBC: 5.3 10*3/uL (ref 4.0–10.5)
nRBC: 0 % (ref 0.0–0.2)

## 2019-10-20 LAB — GLUCOSE, CAPILLARY: Glucose-Capillary: 95 mg/dL (ref 70–99)

## 2019-10-21 LAB — HEMOGLOBIN A1C
Hgb A1c MFr Bld: 6.7 % — ABNORMAL HIGH (ref 4.8–5.6)
Mean Plasma Glucose: 146 mg/dL

## 2019-10-24 ENCOUNTER — Other Ambulatory Visit (HOSPITAL_COMMUNITY)
Admission: RE | Admit: 2019-10-24 | Discharge: 2019-10-24 | Disposition: A | Discharge status: Home / Self Care | Payer: 59 | Source: Ambulatory Visit | Attending: Obstetrics | Admitting: Obstetrics

## 2019-10-24 DIAGNOSIS — Z20822 Contact with and (suspected) exposure to covid-19: Secondary | ICD-10-CM | POA: Diagnosis not present

## 2019-10-24 DIAGNOSIS — Z01812 Encounter for preprocedural laboratory examination: Secondary | ICD-10-CM | POA: Diagnosis present

## 2019-10-24 LAB — SARS CORONAVIRUS 2 (TAT 6-24 HRS): SARS Coronavirus 2: NEGATIVE

## 2019-10-28 ENCOUNTER — Ambulatory Visit (HOSPITAL_BASED_OUTPATIENT_CLINIC_OR_DEPARTMENT_OTHER)
Admission: RE | Admit: 2019-10-28 | Discharge: 2019-10-28 | Disposition: A | Discharge status: Home / Self Care | Payer: 59 | Source: Ambulatory Visit | Attending: Obstetrics | Admitting: Obstetrics

## 2019-10-28 ENCOUNTER — Other Ambulatory Visit: Payer: Self-pay

## 2019-10-28 ENCOUNTER — Encounter (HOSPITAL_BASED_OUTPATIENT_CLINIC_OR_DEPARTMENT_OTHER): Payer: Self-pay | Admitting: Obstetrics

## 2019-10-28 DIAGNOSIS — D259 Leiomyoma of uterus, unspecified: Secondary | ICD-10-CM | POA: Insufficient documentation

## 2019-10-28 DIAGNOSIS — R102 Pelvic and perineal pain: Secondary | ICD-10-CM | POA: Diagnosis not present

## 2019-10-28 DIAGNOSIS — I10 Essential (primary) hypertension: Secondary | ICD-10-CM | POA: Diagnosis not present

## 2019-10-28 DIAGNOSIS — M1990 Unspecified osteoarthritis, unspecified site: Secondary | ICD-10-CM | POA: Insufficient documentation

## 2019-10-28 DIAGNOSIS — E785 Hyperlipidemia, unspecified: Secondary | ICD-10-CM | POA: Insufficient documentation

## 2019-10-28 DIAGNOSIS — Z5309 Procedure and treatment not carried out because of other contraindication: Secondary | ICD-10-CM | POA: Diagnosis not present

## 2019-10-28 LAB — TYPE AND SCREEN
ABO/RH(D): A POS
Antibody Screen: NEGATIVE

## 2019-10-28 LAB — ABO/RH: ABO/RH(D): A POS

## 2019-10-28 MED ORDER — CEFAZOLIN SODIUM-DEXTROSE 2-4 GM/100ML-% IV SOLN: 2.0000 g | INTRAVENOUS | Status: DC

## 2019-10-28 MED ORDER — DEXAMETHASONE SODIUM PHOSPHATE 10 MG/ML IJ SOLN
INTRAMUSCULAR | Status: AC
  Filled 2019-10-28: qty 1

## 2019-10-28 MED ORDER — ROCURONIUM BROMIDE 10 MG/ML (PF) SYRINGE
PREFILLED_SYRINGE | INTRAVENOUS | Status: AC
  Filled 2019-10-28: qty 10

## 2019-10-28 MED ORDER — MIDAZOLAM HCL 2 MG/2ML IJ SOLN
INTRAMUSCULAR | Status: AC
  Filled 2019-10-28: qty 2

## 2019-10-28 MED ORDER — LIDOCAINE 2% (20 MG/ML) 5 ML SYRINGE
INTRAMUSCULAR | Status: AC
  Filled 2019-10-28: qty 5

## 2019-10-28 MED ORDER — LACTATED RINGERS IV SOLN: INTRAVENOUS | Status: DC

## 2019-10-28 MED ORDER — ONDANSETRON HCL 4 MG/2ML IJ SOLN
INTRAMUSCULAR | Status: AC
  Filled 2019-10-28: qty 2

## 2019-10-28 MED ORDER — PHENYLEPHRINE 40 MCG/ML (10ML) SYRINGE FOR IV PUSH (FOR BLOOD PRESSURE SUPPORT)
PREFILLED_SYRINGE | INTRAVENOUS | Status: AC
  Filled 2019-10-28: qty 10

## 2019-10-28 MED ORDER — FENTANYL CITRATE (PF) 250 MCG/5ML IJ SOLN
INTRAMUSCULAR | Status: AC
  Filled 2019-10-28: qty 5

## 2019-10-28 MED ORDER — POVIDONE-IODINE 10 % EX SWAB: 2.0000 "application " | Freq: Once | CUTANEOUS | Status: DC

## 2019-10-28 MED ORDER — CEFAZOLIN SODIUM-DEXTROSE 2-4 GM/100ML-% IV SOLN
INTRAVENOUS | Status: AC
  Filled 2019-10-28: qty 100

## 2019-10-28 NOTE — Anesthesia Preprocedure Evaluation (Addendum)
Anesthesia Evaluation  Patient identified by MRN, date of birth, ID band Patient awake    Reviewed: Allergy & Precautions, NPO status , Patient's Chart, lab work & pertinent test results  Airway Mallampati: II  TM Distance: >3 FB Neck ROM: Full    Dental no notable dental hx. (+) Teeth Intact, Dental Advisory Given, Chipped,    Pulmonary neg pulmonary ROS,    Pulmonary exam normal breath sounds clear to auscultation       Cardiovascular hypertension, Pt. on medications Normal cardiovascular exam Rhythm:Regular Rate:Normal  HLD   Neuro/Psych  Headaches, PSYCHIATRIC DISORDERS Depression    GI/Hepatic Neg liver ROS, GERD  Medicated and Controlled,  Endo/Other  negative endocrine ROS  Renal/GU negative Renal ROS  negative genitourinary   Musculoskeletal  (+) Arthritis ,   Abdominal (+) + obese,   Peds  Hematology negative hematology ROS (+)   Anesthesia Other Findings   Reproductive/Obstetrics                           Anesthesia Physical Anesthesia Plan  ASA: II  Anesthesia Plan: General   Post-op Pain Management:    Induction: Intravenous  PONV Risk Score and Plan: 3 and Midazolam, Dexamethasone and Ondansetron  Airway Management Planned: Oral ETT  Additional Equipment: None  Intra-op Plan:   Post-operative Plan: Extubation in OR  Informed Consent: I have reviewed the patients History and Physical, chart, labs and discussed the procedure including the risks, benefits and alternatives for the proposed anesthesia with the patient or authorized representative who has indicated his/her understanding and acceptance.     Dental advisory given  Plan Discussed with: CRNA  Anesthesia Plan Comments: (GA w Lidocaine infusion )     Anesthesia Quick Evaluation

## 2019-10-28 NOTE — Progress Notes (Signed)
Patient surgery canceled due to case prior running way over time. Dr. Pamala Hurry is aware and has rescheduled the patient for 10/31/2019. Dr. Pamala Hurry spoke with the patient by phone.

## 2019-10-30 NOTE — Progress Notes (Addendum)
Addendum patient called back and stated understanding all pre op instructions and has been quaranting at home as instructed   Left voicemail message for patient npo food after midnight clear liquids until 430 am, then npo follow all other pre op instructions given and stay in quarantine until after 11-02-2019 surgery, arrive 530 am 10-31-2019 [facility].

## 2019-10-31 ENCOUNTER — Encounter (HOSPITAL_BASED_OUTPATIENT_CLINIC_OR_DEPARTMENT_OTHER): Payer: Self-pay | Admitting: Obstetrics

## 2019-10-31 ENCOUNTER — Ambulatory Visit (HOSPITAL_BASED_OUTPATIENT_CLINIC_OR_DEPARTMENT_OTHER)
Admission: RE | Admit: 2019-10-31 | Discharge: 2019-11-01 | Disposition: A | Discharge status: Home / Self Care | Payer: 59 | Source: Ambulatory Visit | Attending: Obstetrics | Admitting: Obstetrics

## 2019-10-31 ENCOUNTER — Ambulatory Visit (HOSPITAL_BASED_OUTPATIENT_CLINIC_OR_DEPARTMENT_OTHER): Payer: 59 | Admitting: Certified Registered Nurse Anesthetist

## 2019-10-31 ENCOUNTER — Encounter (HOSPITAL_BASED_OUTPATIENT_CLINIC_OR_DEPARTMENT_OTHER)
Admission: RE | Disposition: A | Discharge status: Home / Self Care | Payer: Self-pay | Source: Ambulatory Visit | Attending: Obstetrics

## 2019-10-31 DIAGNOSIS — Z79899 Other long term (current) drug therapy: Secondary | ICD-10-CM | POA: Diagnosis not present

## 2019-10-31 DIAGNOSIS — Z90710 Acquired absence of both cervix and uterus: Secondary | ICD-10-CM | POA: Diagnosis present

## 2019-10-31 DIAGNOSIS — M1990 Unspecified osteoarthritis, unspecified site: Secondary | ICD-10-CM | POA: Insufficient documentation

## 2019-10-31 DIAGNOSIS — I10 Essential (primary) hypertension: Secondary | ICD-10-CM | POA: Diagnosis not present

## 2019-10-31 DIAGNOSIS — R7303 Prediabetes: Secondary | ICD-10-CM | POA: Diagnosis not present

## 2019-10-31 DIAGNOSIS — D259 Leiomyoma of uterus, unspecified: Secondary | ICD-10-CM | POA: Insufficient documentation

## 2019-10-31 HISTORY — PX: ROBOTIC ASSISTED TOTAL HYSTERECTOMY WITH BILATERAL SALPINGO OOPHERECTOMY: SHX6086

## 2019-10-31 LAB — BASIC METABOLIC PANEL
Anion gap: 9 (ref 5–15)
BUN: 14 mg/dL (ref 6–20)
CO2: 25 mmol/L (ref 22–32)
Calcium: 9.2 mg/dL (ref 8.9–10.3)
Chloride: 106 mmol/L (ref 98–111)
Creatinine, Ser: 0.84 mg/dL (ref 0.44–1.00)
GFR calc Af Amer: >60 mL/min (ref >60)
GFR calc non Af Amer: >60 mL/min (ref >60)
Glucose, Bld: 136 mg/dL — ABNORMAL HIGH (ref 70–99)
Potassium: 3.7 mmol/L (ref 3.5–5.1)
Sodium: 140 mmol/L (ref 135–145)

## 2019-10-31 LAB — CBC
HCT: 41.3 % (ref 36.0–46.0)
Hemoglobin: 13.9 g/dL (ref 12.0–15.0)
MCH: 31.2 pg (ref 26.0–34.0)
MCHC: 33.7 g/dL (ref 30.0–36.0)
MCV: 92.8 fL (ref 80.0–100.0)
Platelets: 227 10*3/uL (ref 150–400)
RBC: 4.45 MIL/uL (ref 3.87–5.11)
RDW: 14.8 % (ref 11.5–15.5)
WBC: 5.6 10*3/uL (ref 4.0–10.5)
nRBC: 0 % (ref 0.0–0.2)

## 2019-10-31 LAB — TYPE AND SCREEN
ABO/RH(D): A POS
Antibody Screen: NEGATIVE

## 2019-10-31 SURGERY — HYSTERECTOMY, TOTAL, ROBOT-ASSISTED, LAPAROSCOPIC, WITH BILATERAL SALPINGO-OOPHORECTOMY
Anesthesia: General | Laterality: Bilateral

## 2019-10-31 MED ORDER — ONDANSETRON HCL 4 MG/2ML IJ SOLN
INTRAMUSCULAR | Status: AC
  Filled 2019-10-31: qty 2

## 2019-10-31 MED ORDER — PROPOFOL 500 MG/50ML IV EMUL
INTRAVENOUS | Status: DC | PRN
  Administered 2019-10-31: 25 ug/kg/min via INTRAVENOUS

## 2019-10-31 MED ORDER — OXYCODONE HCL 5 MG PO TABS
5.0000 mg | ORAL_TABLET | ORAL | Status: DC | PRN
  Administered 2019-10-31: 5 mg via ORAL
  Administered 2019-10-31: 10 mg via ORAL
  Administered 2019-10-31: 5 mg via ORAL
  Administered 2019-11-01 (×2): 10 mg via ORAL

## 2019-10-31 MED ORDER — ACETAMINOPHEN 500 MG PO TABS
ORAL_TABLET | ORAL | Status: AC
  Filled 2019-10-31: qty 2

## 2019-10-31 MED ORDER — FENTANYL CITRATE (PF) 100 MCG/2ML IJ SOLN
INTRAMUSCULAR | Status: AC
  Filled 2019-10-31: qty 2

## 2019-10-31 MED ORDER — PROPOFOL 10 MG/ML IV BOLUS
INTRAVENOUS | Status: DC | PRN
  Administered 2019-10-31: 150 mg via INTRAVENOUS

## 2019-10-31 MED ORDER — SUGAMMADEX SODIUM 200 MG/2ML IV SOLN
INTRAVENOUS | Status: DC | PRN
  Administered 2019-10-31: 200 mg via INTRAVENOUS

## 2019-10-31 MED ORDER — GLYCOPYRROLATE PF 0.2 MG/ML IJ SOSY
PREFILLED_SYRINGE | INTRAMUSCULAR | Status: AC
  Filled 2019-10-31: qty 1

## 2019-10-31 MED ORDER — SODIUM CHLORIDE 0.9 % IV SOLN: INTRAVENOUS | Status: DC

## 2019-10-31 MED ORDER — LIDOCAINE HCL (PF) 2 % IJ SOLN
INTRAMUSCULAR | Status: DC | PRN
  Administered 2019-10-31: 1.5 mg/kg/h via INTRADERMAL

## 2019-10-31 MED ORDER — ONDANSETRON HCL 4 MG/2ML IJ SOLN
INTRAMUSCULAR | Status: DC | PRN
  Administered 2019-10-31 (×2): 4 mg via INTRAVENOUS

## 2019-10-31 MED ORDER — FENTANYL CITRATE (PF) 100 MCG/2ML IJ SOLN
INTRAMUSCULAR | Status: DC | PRN
Start: 2019-10-31 — End: 2019-10-31
  Administered 2019-10-31: 50 ug via INTRAVENOUS
  Administered 2019-10-31: 25 ug via INTRAVENOUS
  Administered 2019-10-31: 50 ug via INTRAVENOUS
  Administered 2019-10-31: 25 ug via INTRAVENOUS
  Administered 2019-10-31: 50 ug via INTRAVENOUS

## 2019-10-31 MED ORDER — PROPOFOL 500 MG/50ML IV EMUL
INTRAVENOUS | Status: AC
  Filled 2019-10-31: qty 50

## 2019-10-31 MED ORDER — OXYCODONE HCL 5 MG PO TABS
ORAL_TABLET | ORAL | Status: AC
  Filled 2019-10-31: qty 1

## 2019-10-31 MED ORDER — ROCURONIUM BROMIDE 10 MG/ML (PF) SYRINGE
PREFILLED_SYRINGE | INTRAVENOUS | Status: DC | PRN
  Administered 2019-10-31: 10 mg via INTRAVENOUS
  Administered 2019-10-31 (×2): 20 mg via INTRAVENOUS
  Administered 2019-10-31: 50 mg via INTRAVENOUS

## 2019-10-31 MED ORDER — ONDANSETRON HCL 4 MG PO TABS: 4.0000 mg | ORAL_TABLET | Freq: Four times a day (QID) | ORAL | Status: DC | PRN

## 2019-10-31 MED ORDER — CHLORHEXIDINE GLUCONATE 0.12 % MT SOLN: 15.0000 mL | Freq: Once | OROMUCOSAL | Status: DC

## 2019-10-31 MED ORDER — CEFAZOLIN SODIUM-DEXTROSE 2-4 GM/100ML-% IV SOLN
INTRAVENOUS | Status: AC
  Filled 2019-10-31: qty 100

## 2019-10-31 MED ORDER — ACETAMINOPHEN 500 MG PO TABS
1000.0000 mg | ORAL_TABLET | Freq: Four times a day (QID) | ORAL | Status: DC
  Administered 2019-10-31 – 2019-11-01 (×4): 1000 mg via ORAL

## 2019-10-31 MED ORDER — OXYCODONE HCL 5 MG PO TABS
ORAL_TABLET | ORAL | Status: AC
Start: 2019-10-31 — End: ?
  Filled 2019-10-31: qty 2

## 2019-10-31 MED ORDER — PANTOPRAZOLE SODIUM 40 MG PO TBEC
DELAYED_RELEASE_TABLET | ORAL | Status: AC
  Filled 2019-10-31: qty 1

## 2019-10-31 MED ORDER — LACTATED RINGERS IV SOLN: INTRAVENOUS | Status: DC | PRN

## 2019-10-31 MED ORDER — DEXAMETHASONE SODIUM PHOSPHATE 10 MG/ML IJ SOLN
INTRAMUSCULAR | Status: AC
  Filled 2019-10-31: qty 1

## 2019-10-31 MED ORDER — LIDOCAINE 2% (20 MG/ML) 5 ML SYRINGE
INTRAMUSCULAR | Status: AC
  Filled 2019-10-31: qty 5

## 2019-10-31 MED ORDER — LIDOCAINE 2% (20 MG/ML) 5 ML SYRINGE
INTRAMUSCULAR | Status: AC
  Filled 2019-10-31: qty 10

## 2019-10-31 MED ORDER — MIDAZOLAM HCL 2 MG/2ML IJ SOLN
INTRAMUSCULAR | Status: DC | PRN
  Administered 2019-10-31: 2 mg via INTRAVENOUS

## 2019-10-31 MED ORDER — IBUPROFEN 800 MG PO TABS
ORAL_TABLET | ORAL | Status: AC
  Filled 2019-10-31: qty 1

## 2019-10-31 MED ORDER — PHENYLEPHRINE 40 MCG/ML (10ML) SYRINGE FOR IV PUSH (FOR BLOOD PRESSURE SUPPORT)
PREFILLED_SYRINGE | INTRAVENOUS | Status: DC | PRN
  Administered 2019-10-31: 40 ug via INTRAVENOUS
  Administered 2019-10-31: 80 ug via INTRAVENOUS
  Administered 2019-10-31: 40 ug via INTRAVENOUS
  Administered 2019-10-31: 80 ug via INTRAVENOUS
  Administered 2019-10-31 (×2): 40 ug via INTRAVENOUS

## 2019-10-31 MED ORDER — PANTOPRAZOLE SODIUM 40 MG PO TBEC
40.0000 mg | DELAYED_RELEASE_TABLET | Freq: Every day | ORAL | Status: DC
  Administered 2019-10-31: 40 mg via ORAL

## 2019-10-31 MED ORDER — LOSARTAN POTASSIUM 50 MG PO TABS
50.0000 mg | ORAL_TABLET | Freq: Every day | ORAL | Status: DC
  Administered 2019-10-31: 50 mg via ORAL
  Filled 2019-10-31: qty 1

## 2019-10-31 MED ORDER — ONDANSETRON HCL 4 MG/2ML IJ SOLN: 4.0000 mg | Freq: Four times a day (QID) | INTRAMUSCULAR | Status: DC | PRN

## 2019-10-31 MED ORDER — CEFAZOLIN SODIUM-DEXTROSE 2-4 GM/100ML-% IV SOLN
2.0000 g | Freq: Once | INTRAVENOUS | Status: AC
  Administered 2019-10-31: 2 g via INTRAVENOUS

## 2019-10-31 MED ORDER — IBUPROFEN 800 MG PO TABS: 800.0000 mg | ORAL_TABLET | Freq: Three times a day (TID) | ORAL | Status: DC

## 2019-10-31 MED ORDER — ESTRADIOL 0.1 MG/GM VA CREA
TOPICAL_CREAM | VAGINAL | Status: AC
  Filled 2019-10-31: qty 42.5

## 2019-10-31 MED ORDER — LIDOCAINE 2% (20 MG/ML) 5 ML SYRINGE
INTRAMUSCULAR | Status: DC | PRN
  Administered 2019-10-31: 80 mg via INTRAVENOUS

## 2019-10-31 MED ORDER — MIDAZOLAM HCL 2 MG/2ML IJ SOLN
INTRAMUSCULAR | Status: AC
  Filled 2019-10-31: qty 2

## 2019-10-31 MED ORDER — LACTATED RINGERS IV SOLN: INTRAVENOUS | Status: DC

## 2019-10-31 MED ORDER — SODIUM CHLORIDE 0.9 % IV SOLN: INTRAVENOUS | Status: DC | PRN

## 2019-10-31 MED ORDER — SIMETHICONE 80 MG PO CHEW: 80.0000 mg | CHEWABLE_TABLET | Freq: Four times a day (QID) | ORAL | Status: DC | PRN

## 2019-10-31 MED ORDER — DEXMEDETOMIDINE (PRECEDEX) IN NS 20 MCG/5ML (4 MCG/ML) IV SYRINGE
PREFILLED_SYRINGE | INTRAVENOUS | Status: AC
  Filled 2019-10-31: qty 5

## 2019-10-31 MED ORDER — DEXAMETHASONE SODIUM PHOSPHATE 10 MG/ML IJ SOLN
INTRAMUSCULAR | Status: DC | PRN
  Administered 2019-10-31: 10 mg via INTRAVENOUS

## 2019-10-31 MED ORDER — DEXMEDETOMIDINE (PRECEDEX) IN NS 20 MCG/5ML (4 MCG/ML) IV SYRINGE
PREFILLED_SYRINGE | INTRAVENOUS | Status: DC | PRN
  Administered 2019-10-31: 12 ug via INTRAVENOUS
  Administered 2019-10-31: 4 ug via INTRAVENOUS

## 2019-10-31 MED ORDER — ORAL CARE MOUTH RINSE: 15.0000 mL | Freq: Once | OROMUCOSAL | Status: DC

## 2019-10-31 MED ORDER — FENTANYL CITRATE (PF) 250 MCG/5ML IJ SOLN
INTRAMUSCULAR | Status: AC
Start: 2019-10-31 — End: ?
  Filled 2019-10-31: qty 5

## 2019-10-31 MED ORDER — MENTHOL 3 MG MT LOZG: 1.0000 | LOZENGE | OROMUCOSAL | Status: DC | PRN

## 2019-10-31 MED ORDER — BUPIVACAINE HCL (PF) 0.25 % IJ SOLN
INTRAMUSCULAR | Status: DC | PRN
  Administered 2019-10-31: 20 mL

## 2019-10-31 MED ORDER — HYDROMORPHONE HCL 1 MG/ML IJ SOLN: 0.2000 mg | INTRAMUSCULAR | Status: DC | PRN

## 2019-10-31 MED ORDER — KETOROLAC TROMETHAMINE 30 MG/ML IJ SOLN
INTRAMUSCULAR | Status: DC | PRN
  Administered 2019-10-31: 30 mg via INTRAVENOUS

## 2019-10-31 MED ORDER — FENTANYL CITRATE (PF) 100 MCG/2ML IJ SOLN
25.0000 ug | INTRAMUSCULAR | Status: DC | PRN
  Administered 2019-10-31: 50 ug via INTRAVENOUS
  Administered 2019-10-31: 25 ug via INTRAVENOUS

## 2019-10-31 MED ORDER — GLYCOPYRROLATE 0.2 MG/ML IJ SOLN
INTRAMUSCULAR | Status: DC | PRN
  Administered 2019-10-31 (×2): .1 mg via INTRAVENOUS

## 2019-10-31 MED ORDER — PROPOFOL 10 MG/ML IV BOLUS
INTRAVENOUS | Status: AC
  Filled 2019-10-31: qty 20

## 2019-10-31 MED ORDER — KETOROLAC TROMETHAMINE 30 MG/ML IJ SOLN
INTRAMUSCULAR | Status: AC
  Filled 2019-10-31: qty 1

## 2019-10-31 MED ORDER — IBUPROFEN 800 MG PO TABS
800.0000 mg | ORAL_TABLET | Freq: Three times a day (TID) | ORAL | Status: DC
  Administered 2019-10-31 – 2019-11-01 (×2): 800 mg via ORAL

## 2019-10-31 MED ORDER — EPHEDRINE SULFATE-NACL 50-0.9 MG/10ML-% IV SOSY
PREFILLED_SYRINGE | INTRAVENOUS | Status: DC | PRN
  Administered 2019-10-31: 10 mg via INTRAVENOUS

## 2019-10-31 MED ORDER — ROCURONIUM BROMIDE 10 MG/ML (PF) SYRINGE
PREFILLED_SYRINGE | INTRAVENOUS | Status: AC
  Filled 2019-10-31: qty 10

## 2019-10-31 SURGICAL SUPPLY — 62 items
ADH SKN CLS APL DERMABOND .7 (GAUZE/BANDAGES/DRESSINGS) ×1
APL PRP STRL LF DISP 70% ISPRP (MISCELLANEOUS) ×1
BARRIER ADHS 3X4 INTERCEED (GAUZE/BANDAGES/DRESSINGS) IMPLANT
BRR ADH 4X3 ABS CNTRL BYND (GAUZE/BANDAGES/DRESSINGS)
CHLORAPREP W/TINT 26 (MISCELLANEOUS) ×2 IMPLANT
CNTNR URN SCR LID CUP LEK RST (MISCELLANEOUS) ×1 IMPLANT
CONT SPEC 4OZ STRL OR WHT (MISCELLANEOUS) ×2
COVER BACK TABLE 60X90IN (DRAPES) ×2 IMPLANT
COVER TIP SHEARS 8 DVNC (MISCELLANEOUS) ×1 IMPLANT
COVER TIP SHEARS 8MM DA VINCI (MISCELLANEOUS) ×4
DECANTER SPIKE VIAL GLASS SM (MISCELLANEOUS) ×4 IMPLANT
DEFOGGER SCOPE WARMER CLEARIFY (MISCELLANEOUS) ×2 IMPLANT
DERMABOND ADVANCED (GAUZE/BANDAGES/DRESSINGS) ×1
DERMABOND ADVANCED .7 DNX12 (GAUZE/BANDAGES/DRESSINGS) ×1 IMPLANT
DRAPE ARM DVNC X/XI (DISPOSABLE) ×4 IMPLANT
DRAPE COLUMN DVNC XI (DISPOSABLE) ×1 IMPLANT
DRAPE DA VINCI XI ARM (DISPOSABLE) ×8
DRAPE DA VINCI XI COLUMN (DISPOSABLE) ×2
DRAPE UTILITY XL STRL (DRAPES) ×2 IMPLANT
ELECT REM PT RETURN 9FT ADLT (ELECTROSURGICAL) ×2
ELECTRODE REM PT RTRN 9FT ADLT (ELECTROSURGICAL) ×1 IMPLANT
GAUZE PETROLATUM 1 X8 (GAUZE/BANDAGES/DRESSINGS) ×1 IMPLANT
GLOVE BIO SURGEON STRL SZ 6.5 (GLOVE) ×6 IMPLANT
GLOVE BIOGEL PI IND STRL 7.0 (GLOVE) ×5 IMPLANT
GLOVE BIOGEL PI INDICATOR 7.0 (GLOVE) ×6
GLOVE SURG SS PI 6.5 STRL IVOR (GLOVE) ×1 IMPLANT
HOLDER FOLEY CATH W/STRAP (MISCELLANEOUS) IMPLANT
IRRIG SUCT STRYKERFLOW 2 WTIP (MISCELLANEOUS) ×2
IRRIGATION SUCT STRKRFLW 2 WTP (MISCELLANEOUS) ×1 IMPLANT
IV NS 1000ML (IV SOLUTION) ×2
IV NS 1000ML BAXH (IV SOLUTION) ×1 IMPLANT
LEGGING LITHOTOMY PAIR STRL (DRAPES) ×2 IMPLANT
OBTURATOR OPTICAL STANDARD 8MM (TROCAR) ×2
OBTURATOR OPTICAL STND 8 DVNC (TROCAR) ×1
OBTURATOR OPTICALSTD 8 DVNC (TROCAR) ×1 IMPLANT
OCCLUDER COLPOPNEUMO (BALLOONS) ×2 IMPLANT
PACK ROBOT WH (CUSTOM PROCEDURE TRAY) ×2 IMPLANT
PACK ROBOTIC GOWN (GOWN DISPOSABLE) ×2 IMPLANT
PACK TRENDGUARD 450 HYBRID PRO (MISCELLANEOUS) IMPLANT
PAD OB MATERNITY 4.3X12.25 (PERSONAL CARE ITEMS) ×2 IMPLANT
PAD PREP 24X48 CUFFED NSTRL (MISCELLANEOUS) ×2 IMPLANT
PROTECTOR NERVE ULNAR (MISCELLANEOUS) ×4 IMPLANT
SEAL CANN UNIV 5-8 DVNC XI (MISCELLANEOUS) ×4 IMPLANT
SEAL XI 5MM-8MM UNIVERSAL (MISCELLANEOUS) ×8
SEALER VESSEL DA VINCI XI (MISCELLANEOUS) ×2
SEALER VESSEL EXT DVNC XI (MISCELLANEOUS) ×1 IMPLANT
SET TRI-LUMEN FLTR TB AIRSEAL (TUBING) ×2 IMPLANT
SUT VIC AB 0 CT1 27 (SUTURE) ×4
SUT VIC AB 0 CT1 27XBRD ANBCTR (SUTURE) ×2 IMPLANT
SUT VIC AB 4-0 PS2 27 (SUTURE) ×4 IMPLANT
SUT VICRYL 0 UR6 27IN ABS (SUTURE) ×2 IMPLANT
SUT VLOC 180 0 9IN  GS21 (SUTURE) ×2
SUT VLOC 180 0 9IN GS21 (SUTURE) ×1 IMPLANT
TIP RUMI ORANGE 6.7MMX12CM (TIP) IMPLANT
TIP UTERINE 5.1X6CM LAV DISP (MISCELLANEOUS) IMPLANT
TIP UTERINE 6.7X10CM GRN DISP (MISCELLANEOUS) ×1 IMPLANT
TIP UTERINE 6.7X6CM WHT DISP (MISCELLANEOUS) IMPLANT
TIP UTERINE 6.7X8CM BLUE DISP (MISCELLANEOUS) IMPLANT
TOWEL OR 17X26 10 PK STRL BLUE (TOWEL DISPOSABLE) ×2 IMPLANT
TRAY FOLEY W/BAG SLVR 14FR LF (SET/KITS/TRAYS/PACK) ×2 IMPLANT
TRENDGUARD 450 HYBRID PRO PACK (MISCELLANEOUS)
TROCAR PORT AIRSEAL 8X120 (TROCAR) ×2 IMPLANT

## 2019-10-31 NOTE — Transfer of Care (Signed)
Immediate Anesthesia Transfer of Care Note  Patient: Kandis Cocking  Procedure(s) Performed: XI ROBOTIC ASSISTED TOTAL HYSTERECTOMY WITH BILATERAL SALPINGO OOPHORECTOMY (Bilateral )  Patient Location: PACU  Anesthesia Type:General  Level of Consciousness: drowsy  Airway & Oxygen Therapy: Patient Spontanous Breathing and Patient connected to face mask oxygen  Post-op Assessment: Report given to RN and Post -op Vital signs reviewed and stable  Post vital signs: Reviewed and stable  Last Vitals:  Vitals Value Taken Time  BP 133/90 10/31/19 1100  Temp    Pulse 81 10/31/19 1105  Resp 13 10/31/19 1105  SpO2 100 % 10/31/19 1105  Vitals shown include unvalidated device data.  Last Pain:  Vitals:   10/31/19 0603  PainSc: 7       Patients Stated Pain Goal: 4 (74/25/52 5894)  Complications: No complications documented.

## 2019-10-31 NOTE — Anesthesia Postprocedure Evaluation (Signed)
Anesthesia Post Note  Patient: Kathryn Parks  Procedure(s) Performed: XI ROBOTIC ASSISTED TOTAL HYSTERECTOMY WITH BILATERAL SALPINGO OOPHORECTOMY (Bilateral )     Patient location during evaluation: PACU Anesthesia Type: General Level of consciousness: awake and alert Pain management: pain level controlled Vital Signs Assessment: post-procedure vital signs reviewed and stable Respiratory status: spontaneous breathing, nonlabored ventilation, respiratory function stable and patient connected to nasal cannula oxygen Cardiovascular status: blood pressure returned to baseline and stable Postop Assessment: no apparent nausea or vomiting Anesthetic complications: no   No complications documented.  Last Vitals:  Vitals:   10/31/19 1225 10/31/19 1245  BP: (!) 158/93 (!) 162/106  Pulse: 87 84  Resp: 16 15  Temp: 36.7 C 36.4 C  SpO2: 99% 97%    Last Pain:  Vitals:   10/31/19 1245  PainSc: 8                  Barnet Glasgow

## 2019-10-31 NOTE — Anesthesia Procedure Notes (Signed)
Procedure Name: Intubation Date/Time: 10/31/2019 7:36 AM Performed by: Gwyndolyn Saxon, CRNA Pre-anesthesia Checklist: Patient identified, Emergency Drugs available, Suction available and Patient being monitored Patient Re-evaluated:Patient Re-evaluated prior to induction Oxygen Delivery Method: Circle system utilized Preoxygenation: Pre-oxygenation with 100% oxygen Induction Type: IV induction Ventilation: Mask ventilation without difficulty Laryngoscope Size: Miller and 2 Grade View: Grade I Tube type: Oral Tube size: 7.0 mm Number of attempts: 1 Airway Equipment and Method: Patient positioned with wedge pillow and Stylet Placement Confirmation: ETT inserted through vocal cords under direct vision,  positive ETCO2 and breath sounds checked- equal and bilateral Secured at: 22 cm Tube secured with: Tape Dental Injury: Teeth and Oropharynx as per pre-operative assessment

## 2019-10-31 NOTE — Op Note (Signed)
10/31/2019  10:44 AM  PATIENT:  Kathryn Parks  59 y.o. female  PRE-OPERATIVE DIAGNOSIS:  Pelvic Pain, fibroid uterus  POST-OPERATIVE DIAGNOSIS:  Pelvic Pain, fibroid uterus  PROCEDURE:  Procedure(s): XI ROBOTIC ASSISTED TOTAL HYSTERECTOMY WITH BILATERAL SALPINGO OOPHORECTOMY (Bilateral)  SURGEON:  Surgeon(s) and Role:    * Aloha Gell, MD - Primary  PHYSICIAN ASSISTANT: None  ASSISTANTS: R FNA  ANESTHESIA:   local and general  EBL:  50 mL   BLOOD ADMINISTERED:none  DRAINS: Urinary Catheter (Foley)   LOCAL MEDICATIONS USED:  MARCAINE    and BUPIVICAINE   SPECIMEN:  Source of Specimen:  Uterus cervix bilateral tubes and ovaries  DISPOSITION OF SPECIMEN:  PATHOLOGY  COUNTS:  YES  TOURNIQUET:  * No tourniquets in log *  DICTATION: .Note written in EPIC  PLAN OF CARE: Admit for overnight observation  PATIENT DISPOSITION:  PACU - hemodynamically stable.   Delay start of Pharmacological VTE agent (>24hrs) due to surgical blood loss or risk of bleeding: yes   Procedure:Robotic-assisted total laparoscopic hysterectomy with BSO Complications: none Antibiotics: 2 g Ancef Findings: nl b/l ovaries and tubes, fibroid uterus, normal appendix, normal liver edge, peristalsing bilateral ureters identified during the case,  hemostasis post-procedure.  Indications: This is a 59 year old G1, P1 with persistent pelvic pain felt due to large fibroid uterus.  Patient understood stood risks and benefits and desired hysterectomy with BSO.    Procedure: After informed consent obtained, the patient was taken to the operating room where general anesthesia was initiated without difficulty.   She was prepped and draped in normal sterile fashion in the dorsal supine lithotomy position. A Foley catheter was inserted sterilely into the bladder. A bimanual examination was done to assess the size and position of the uterus. A weighted speculum was placed in the vagina and deaver retractors  were used on the anterior vaginal wall. .The cervix was grasped with tenaculum. The cervix was sounded to 8 cm. The cervix was assessed to identify the Rumi-Co size.  A medium cup and an 10 cm shaft was used. The uterine balloon was inflated.  Gloved were changed. Attention was then turned to the patient's abdomen. 0.5 % marcaine was used prior to all incision. A total of 10 cc of marcaine was used.  A 10 mm incision was made in the umbilicus and blunt and sharp dissection was done until the fascia was identified. This was then grasped with Kocher clamps x2 and entered sharply. A pursestring suture of 0 Vicryl was then placed along the incision and a non-bladed Sheryle Hail was inserted into the peritoneal cavity. Intraperitoneal placement was confirmed with the use of the camera and pneumoperitoneum was created to 15 mm of mercury. The pursestring suture was secured around the port and pneumoperitoneum was maintained. Brief survey of the abdomen and pelvis was done with findings as above. The abdominal wall was assessed and additional port sites were marked. 8 mm incisions were placed in the right (two) and left lower quadrants (2) and non-bladed ports were placed under direct visualization. The robot was brought to the patient's side and attached with the right side docking. The robotic instruments were placed under direct visualization until proper placement just over the uterus.  I then went to the robotic console. Brief survey of the patient's abdomen and pelvis revealed  findings as above.Bilateral tubes and ovaries were normal. No significant adhesions were noted. Pelvic washings were taken.  I began on the left side: the  Left ureter and  IP were identified. The IP was serially cauterized and transected with the vessel sealer. The left round ligament was cauterized and divided.  The anterior leaf of the broad ligament was dissected bluntly then monopolar cautery was used to separate the anterior and posterior  leafs of the broad ligament. This was carried down through to the bladder flap. Attention was then turned to the patient's  Right side: the right IP was serially cauterized and transected.  The right round  ligament was cauterized and transected. The anterior and posterior leaves of the broad ligament were bluntly and sharply dissected apart from each other. Monopolar cautery was used to come across the anterior leaf of the right broad ligament. This dissection was carried down across to the midline to create the bladder flap.  At this point we lost access to the assistant port.  This created a drop in the pneumoperitoneum.  I rescrubbed and replaced the assistant port.  Pneumoperitoneum was improved after this time.    The leftt uterine artery was serially cauterized with the vessel sealer and transected.  The right uterine artery and then serially cauterized with the vessel sealer and transected. The uterus was placed on stretch to allow better visualization of the arteries. The bladder flap was further taken down both sharply and with cautery. Cautery was used for hemostasis on several places along the bladder flap. At this point with the pressure inward on the Rumi, the anterior colpotomy was made with the monopolar scissors. This was carried around to the patient's right side. Additional bipolar cautery was used on the  both angles of the cuff- this was carried out with the vessel sealer. The uterus was then positioned  anteriorly  to allow easy access to the posterior  colpotomy. This was carried out with the monopolar scissors.  Good hemostasis was noted along the angles of the incision.  With manipulation of the specimen, the uterus, cervix,  bilateral tubes and ovaries were pulled out through the vagina.   Irrigation was used and the vaginal cuff appeared hemostatic. A 9 inch V-lock suture was placed though the umbilical port. Instruments were changed to allow a suture cut needle driver through the  #1 port and and a long-tipped forceps through the #2 port. The right angle was closed and locked with the V-lock suture. Running suture was carried out tothe L angle.  A more superficial layer of suture was placed travelling back to the right angle. The suture was cut and removed. Excellent hemostasis was noted. Suction irrigation was carried out and hemostasis was assured along the vaginal cuff. The pedicles were reassessed also found to be hemostatic. All free fluid was removed from the abdomen. The robotic portion was completed. The robot was undocked.   By standard laparoscopy the pelvis was irrigated and evaluated. The patient was placed in reverse Trendelenburg, all additional fluid was suctioned from the abdomen and pelvis the cuff was reinspected and  found to be hemostatic. All pedicles were also found to be hemostatic. 20 CC 1/4% bupivicaine was placed into the peritoneal cavity.  Under direct visualization the ports were removed. Pneumoperitoneum was released and the umbilical port was removed.  The  pursestring suture at the umbilicus was then closed. No additional fascial incisions were closed due to the small size and the nondilated nature of these incisions. A 4-0 Vicryl was used to close the additional laparoscopic ports sites. Good hemostasis was noted.  Due to some vaginal abrasions from the prep and start of  the case vaginal estrogen cream was placed into the vagina.   Sponge lap and needle counts were correct x3 the patient was woken from general anesthesia having tolerated the procedure well and taken to the recovery room in a stable fashion.

## 2019-10-31 NOTE — Brief Op Note (Signed)
10/31/2019  10:44 AM  PATIENT:  Kandis Cocking  59 y.o. female  PRE-OPERATIVE DIAGNOSIS:  Pelvic Pain, fibroid uterus  POST-OPERATIVE DIAGNOSIS:  Pelvic Pain, fibroid uterus  PROCEDURE:  Procedure(s): XI ROBOTIC ASSISTED TOTAL HYSTERECTOMY WITH BILATERAL SALPINGO OOPHORECTOMY (Bilateral)  SURGEON:  Surgeon(s) and Role:    * Aloha Gell, MD - Primary  PHYSICIAN ASSISTANT: None  ASSISTANTS: R FNA  ANESTHESIA:   local and general  EBL:  50 mL   BLOOD ADMINISTERED:none  DRAINS: Urinary Catheter (Foley)   LOCAL MEDICATIONS USED:  MARCAINE    and BUPIVICAINE   SPECIMEN:  Source of Specimen:  Uterus cervix bilateral tubes and ovaries  DISPOSITION OF SPECIMEN:  PATHOLOGY  COUNTS:  YES  TOURNIQUET:  * No tourniquets in log *  DICTATION: .Note written in EPIC  PLAN OF CARE: Admit for overnight observation  PATIENT DISPOSITION:  PACU - hemodynamically stable.   Delay start of Pharmacological VTE agent (>24hrs) due to surgical blood loss or risk of bleeding: yes

## 2019-10-31 NOTE — H&P (Signed)
Chief complaint: Pelvic pain and fibroids  History of present illness: 59 year old G1, P1 menopausal for about 8 years presents with persistent pelvic pain.  Evaluation has been negative for etiologies of pelvic pain other than fibroids.  Patient with sonohysterogram which shows 4 cm fibroid displacing the endometrium.  Uterus 9.7 x 7.3 x 5.8 cm, 6 mm endometrial stripe.  Patient has had D&C x2 for thickened endometrial stripe with benign biopsies.  These were by different gynecologist.  Patient is up-to-date with a normal Pap and HPV - July 21.  No known drug allergies Medications: Losartan 10 mg Past medical history hypertension, fibroids  Past Medical History:  Diagnosis Date  . Arthritis   . DDD (degenerative disc disease), lumbar   . Depression   . Hypertension   . Pre-diabetes      Past Surgical History:  Procedure Laterality Date  . DILATION AND CURETTAGE, DIAGNOSTIC / THERAPEUTIC     x2  . TUBAL LIGATION  1997    Physical: Vitals:   10/31/19 0603  BP: (!) 152/96  Pulse: 70  Resp: 18  Temp: 97.7 F (36.5 C)  SpO2: 100%   General: Well-appearing, no distress Abdomen: Soft, nontender, nondistended GU: Deferred to the OR Lower extremity: Nontender, no edema  CBC    Component Value Date/Time   WBC 5.6 10/31/2019 0626   RBC 4.45 10/31/2019 0626   HGB 13.9 10/31/2019 0626   HGB 14.9 05/10/2015 1630   HCT 41.3 10/31/2019 0626   HCT 42.7 05/10/2015 1630   PLT 227 10/31/2019 0626   PLT 251 05/10/2015 1630   MCV 92.8 10/31/2019 0626   MCV 87 05/10/2015 1630   MCH 31.2 10/31/2019 0626   MCHC 33.7 10/31/2019 0626   RDW 14.8 10/31/2019 0626   RDW 16.0 (H) 05/10/2015 1630   LYMPHSABS 2.6 06/02/2019 1423   MONOABS 0.5 06/02/2019 1423   EOSABS 0.1 06/02/2019 1423   BASOSABS 0.0 06/02/2019 1423    CMP     Component Value Date/Time   NA 140 10/31/2019 0626   NA 142 05/10/2015 1630   K 3.7 10/31/2019 0626   CL 106 10/31/2019 0626   CO2 25 10/31/2019 0626    GLUCOSE 136 (H) 10/31/2019 0626   BUN 14 10/31/2019 0626   BUN 12 05/10/2015 1630   CREATININE 0.84 10/31/2019 0626   CREATININE 0.90 11/05/2012 1646   CALCIUM 9.2 10/31/2019 0626   PROT 7.5 06/02/2019 1423   PROT 7.9 05/10/2015 1630   ALBUMIN 4.5 06/02/2019 1423   ALBUMIN 4.9 05/10/2015 1630   AST 17 06/02/2019 1423   ALT 15 06/02/2019 1423   ALKPHOS 86 06/02/2019 1423   BILITOT 0.3 06/02/2019 1423   BILITOT 0.3 05/10/2015 1630   GFRNONAA >60 10/31/2019 0626   GFRAA >60 10/31/2019 0626    Assessment plan: 59 year old G1, P1 menopausal with fibroids and pelvic pain for robotic assisted total laparoscopic hysterectomy with bilateral salpingo-oophorectomy.  Risk and benefits of surgery in general as well as adding salpingo-oophorectomy discussed with patient.  Plan for overnight stay, resume blood pressure medications tomorrow, recovery and postoperative medications discussed with patient.  Patient also aware no guarantee surgery well cure her pelvic pain.  Patient agrees to plan.  Ala Dach 10/31/2019 7:18 AM

## 2019-11-01 DIAGNOSIS — D259 Leiomyoma of uterus, unspecified: Secondary | ICD-10-CM | POA: Diagnosis not present

## 2019-11-01 LAB — CBC
HCT: 40.8 % (ref 36.0–46.0)
Hemoglobin: 13.5 g/dL (ref 12.0–15.0)
MCH: 31.4 pg (ref 26.0–34.0)
MCHC: 33.1 g/dL (ref 30.0–36.0)
MCV: 94.9 fL (ref 80.0–100.0)
Platelets: 226 10*3/uL (ref 150–400)
RBC: 4.3 MIL/uL (ref 3.87–5.11)
RDW: 15.2 % (ref 11.5–15.5)
WBC: 13.2 10*3/uL — ABNORMAL HIGH (ref 4.0–10.5)
nRBC: 0 % (ref 0.0–0.2)

## 2019-11-01 MED ORDER — OXYCODONE HCL 5 MG PO TABS
ORAL_TABLET | ORAL | Status: AC
  Filled 2019-11-01: qty 2

## 2019-11-01 MED ORDER — OXYCODONE HCL 5 MG PO TABS
5.0000 mg | ORAL_TABLET | ORAL | 0 refills | Status: AC | PRN
Start: 2019-11-01 — End: 2019-11-06

## 2019-11-01 MED ORDER — IBUPROFEN 800 MG PO TABS
ORAL_TABLET | ORAL | Status: AC
  Filled 2019-11-01: qty 1

## 2019-11-01 MED ORDER — IBUPROFEN 800 MG PO TABS
800.0000 mg | ORAL_TABLET | Freq: Three times a day (TID) | ORAL | 1 refills | Status: DC
Start: 2019-11-01 — End: 2020-06-14

## 2019-11-01 MED ORDER — ACETAMINOPHEN 500 MG PO TABS
ORAL_TABLET | ORAL | Status: AC
  Filled 2019-11-01: qty 2

## 2019-11-01 MED ORDER — ACETAMINOPHEN 500 MG PO TABS
ORAL_TABLET | ORAL | Status: AC
  Filled 2019-11-01: qty 1

## 2019-11-01 MED ORDER — ACETAMINOPHEN 500 MG PO TABS: 1000.0000 mg | ORAL_TABLET | Freq: Four times a day (QID) | ORAL | 1 refills | Status: AC

## 2019-11-01 NOTE — Discharge Instructions (Signed)

## 2019-11-01 NOTE — Discharge Summary (Signed)
Kathryn Parks MRN: [id number] DOB/AGE: 1960/09/08 59 y.o.  Admit date: 10/31/2019 Discharge date: November 01, 2019  Admission Diagnoses: S/P hysterectomy with oophorectomy [Z90.710, Z90.721]  Discharge Diagnoses: S/P hysterectomy with oophorectomy [Z90.710, Z90.721]        Active Problems:   S/P hysterectomy with oophorectomy   Discharged Condition: stable  Hospital Course: Patient admitted for surgery as above.  Uneventful surgery with no unexpected complications.  Overnight patient was requiring 2 Percocet every 4 hours.  Her chronic hypertension was known given her mild elevated blood pressures  On postoperative day 1 patient notes small flatus, no bowel movement, tolerating regular p.o.  Patient notes no nausea or emesis.  Patient reports no shortness of breath or chest pain.  Patient reports minimal vaginal spotting.  Patient notes abdominal pain controlled by p.o. medicines.  Foley catheter has been removed patient has ambulated and voided.  Patient has no specific questions.  Vitals:   10/31/19 1821 10/31/19 2205 11/01/19 0200 11/01/19 0638  BP: (!) 156/96 (!) 157/76 128/72 (!) 124/91  Pulse: 100 99 87 76  Resp: 16 18 16 18   Temp: 98 F (36.7 C) 98.9 F (37.2 C) 98.2 F (36.8 C) 97.7 F (36.5 C)  SpO2: 98% 98% 97% 98%  Weight:      Height:       General: Well-appearing, no distress Abdomen: Soft distention, no tympany, appropriately tender Incisions: Slight erythema around umbilical incision.  Intact GU minimal staining Lower extremity: Nontender no edema  CBC Latest Ref Rng & Units 11/01/2019 10/31/2019 10/20/2019  WBC 4.0 - 10.5 K/uL 13.2(H) 5.6 5.3  Hemoglobin 12.0 - 15.0 g/dL 13.5 13.9 14.5  Hematocrit 36 - 46 % 40.8 41.3 43.2  Platelets 150 - 400 K/uL 226 227 249     Consults: None  Treatments: surgery: Robotic assisted total laparoscopic hysterectomy with bilateral salpingo-oophorectomy  Disposition: Discharge disposition: 01-Home or Self  Care        Allergies as of 11/01/2019   No Known Allergies     Medication List    STOP taking these medications   Premarin vaginal cream Generic drug: conjugated estrogens     TAKE these medications   acetaminophen 500 MG tablet Commonly known as: TYLENOL Take 2 tablets (1,000 mg total) by mouth every 6 (six) hours.   atorvastatin 10 MG tablet Commonly known as: LIPITOR TAKE 1 TABLET BY MOUTH EVERY DAY   Cholecalciferol 50 MCG (2000 UT) Tabs Take 1 tablet (2,000 Units total) by mouth daily.   esomeprazole 40 MG capsule Commonly known as: NEXIUM TAKE 1 CAPSULE BY MOUTH EVERY DAY   fexofenadine 180 MG tablet Commonly known as: ALLEGRA Take 180 mg by mouth daily.   fluticasone 50 MCG/ACT nasal spray Commonly known as: FLONASE SPRAY 2 SPRAYS INTO EACH NOSTRIL EVERY DAY What changed: See the new instructions.   ibuprofen 800 MG tablet Commonly known as: ADVIL Take 800 mg by mouth every 8 (eight) hours as needed for moderate pain. What changed: Another medication with the same name was added. Make sure you understand how and when to take each.   ibuprofen 800 MG tablet Commonly known as: ADVIL Take 1 tablet (800 mg total) by mouth every 8 (eight) hours. What changed: You were already taking a medication with the same name, and this prescription was added. Make sure you understand how and when to take each.   indapamide 1.25 MG tablet Commonly known as: LOZOL TAKE 1 TABLET BY MOUTH EVERY DAY   losartan 50  MG tablet Commonly known as: COZAAR Take 50 mg by mouth daily.   meclizine 25 MG tablet Commonly known as: ANTIVERT Take 25 mg by mouth 3 (three) times daily as needed for dizziness.   metFORMIN 500 MG tablet Commonly known as: GLUCOPHAGE Take 500 mg by mouth every morning.   oxyCODONE 5 MG immediate release tablet Commonly known as: Oxy IR/ROXICODONE Take 1-2 tablets (5-10 mg total) by mouth every 4 (four) hours as needed for up to 5 days for moderate  pain.   valACYclovir 1000 MG tablet Commonly known as: Valtrex Take 1 tablet po daily for suppression. What changed:   how much to take  how to take this  when to take this  reasons to take this  additional instructions   VITAMIN C PO Take 1 tablet by mouth daily.   zinc gluconate 50 MG tablet Take 50 mg by mouth daily.        Signed: Ala Dach, MD 11/01/2019, 8:34 AM

## 2019-11-04 LAB — SURGICAL PATHOLOGY

## 2019-11-05 ENCOUNTER — Encounter (HOSPITAL_BASED_OUTPATIENT_CLINIC_OR_DEPARTMENT_OTHER): Payer: Self-pay | Admitting: Obstetrics

## 2019-11-10 LAB — HM PAP SMEAR

## 2019-12-04 ENCOUNTER — Other Ambulatory Visit: Payer: Self-pay | Admitting: Internal Medicine

## 2019-12-04 DIAGNOSIS — I10 Essential (primary) hypertension: Secondary | ICD-10-CM

## 2020-06-03 ENCOUNTER — Other Ambulatory Visit: Payer: Self-pay | Admitting: Internal Medicine

## 2020-06-03 DIAGNOSIS — E785 Hyperlipidemia, unspecified: Secondary | ICD-10-CM

## 2020-06-03 DIAGNOSIS — K2100 Gastro-esophageal reflux disease with esophagitis, without bleeding: Secondary | ICD-10-CM

## 2020-06-14 ENCOUNTER — Other Ambulatory Visit: Payer: Self-pay

## 2020-06-14 ENCOUNTER — Encounter: Payer: Self-pay | Admitting: Internal Medicine

## 2020-06-14 ENCOUNTER — Ambulatory Visit (INDEPENDENT_AMBULATORY_CARE_PROVIDER_SITE_OTHER): Payer: 59 | Admitting: Internal Medicine

## 2020-06-14 ENCOUNTER — Telehealth: Payer: Self-pay | Admitting: Internal Medicine

## 2020-06-14 VITALS — BP 160/102 | HR 79 | Temp 98.4°F | Resp 16 | Ht 65.0 in | Wt 210.6 lb

## 2020-06-14 DIAGNOSIS — E118 Type 2 diabetes mellitus with unspecified complications: Secondary | ICD-10-CM | POA: Diagnosis not present

## 2020-06-14 DIAGNOSIS — I10 Essential (primary) hypertension: Secondary | ICD-10-CM | POA: Diagnosis not present

## 2020-06-14 DIAGNOSIS — E559 Vitamin D deficiency, unspecified: Secondary | ICD-10-CM | POA: Diagnosis not present

## 2020-06-14 DIAGNOSIS — Z0000 Encounter for general adult medical examination without abnormal findings: Secondary | ICD-10-CM | POA: Diagnosis not present

## 2020-06-14 DIAGNOSIS — A6004 Herpesviral vulvovaginitis: Secondary | ICD-10-CM | POA: Insufficient documentation

## 2020-06-14 DIAGNOSIS — E785 Hyperlipidemia, unspecified: Secondary | ICD-10-CM | POA: Diagnosis not present

## 2020-06-14 DIAGNOSIS — Z1231 Encounter for screening mammogram for malignant neoplasm of breast: Secondary | ICD-10-CM

## 2020-06-14 DIAGNOSIS — M545 Low back pain, unspecified: Secondary | ICD-10-CM

## 2020-06-14 DIAGNOSIS — R10814 Left lower quadrant abdominal tenderness: Secondary | ICD-10-CM

## 2020-06-14 DIAGNOSIS — G8929 Other chronic pain: Secondary | ICD-10-CM

## 2020-06-14 LAB — CBC WITH DIFFERENTIAL/PLATELET
Basophils Absolute: 0 10*3/uL (ref 0.0–0.1)
Basophils Relative: 0.5 % (ref 0.0–3.0)
Eosinophils Absolute: 0.1 10*3/uL (ref 0.0–0.7)
Eosinophils Relative: 1.6 % (ref 0.0–5.0)
HCT: 43.4 % (ref 36.0–46.0)
Hemoglobin: 14.5 g/dL (ref 12.0–15.0)
Lymphocytes Relative: 40.3 % (ref 12.0–46.0)
Lymphs Abs: 2.1 10*3/uL (ref 0.7–4.0)
MCHC: 33.3 g/dL (ref 30.0–36.0)
MCV: 92.1 fl (ref 78.0–100.0)
Monocytes Absolute: 0.4 10*3/uL (ref 0.1–1.0)
Monocytes Relative: 7.1 % (ref 3.0–12.0)
Neutro Abs: 2.6 10*3/uL (ref 1.4–7.7)
Neutrophils Relative %: 50.5 % (ref 43.0–77.0)
Platelets: 224 10*3/uL (ref 150.0–400.0)
RBC: 4.72 Mil/uL (ref 3.87–5.11)
RDW: 14.8 % (ref 11.5–15.5)
WBC: 5.2 10*3/uL (ref 4.0–10.5)

## 2020-06-14 LAB — BASIC METABOLIC PANEL
BUN: 12 mg/dL (ref 6–23)
CO2: 30 mEq/L (ref 19–32)
Calcium: 10 mg/dL (ref 8.4–10.5)
Chloride: 102 mEq/L (ref 96–112)
Creatinine, Ser: 0.96 mg/dL (ref 0.40–1.20)
GFR: 64.01 mL/min (ref >60.00)
Glucose, Bld: 148 mg/dL — ABNORMAL HIGH (ref 70–99)
Potassium: 4.1 mEq/L (ref 3.5–5.1)
Sodium: 140 mEq/L (ref 135–145)

## 2020-06-14 LAB — HEMOGLOBIN A1C: Hgb A1c MFr Bld: 7.5 % — ABNORMAL HIGH (ref 4.6–6.5)

## 2020-06-14 LAB — LIPID PANEL
Cholesterol: 186 mg/dL (ref 0–200)
HDL: 43.7 mg/dL (ref >39.00)
LDL Cholesterol: 122 mg/dL — ABNORMAL HIGH (ref 0–99)
NonHDL: 142.1
Total CHOL/HDL Ratio: 4
Triglycerides: 100 mg/dL (ref 0.0–149.0)
VLDL: 20 mg/dL (ref 0.0–40.0)

## 2020-06-14 LAB — HEPATIC FUNCTION PANEL
ALT: 24 U/L (ref 0–35)
AST: 22 U/L (ref 0–37)
Albumin: 4.3 g/dL (ref 3.5–5.2)
Alkaline Phosphatase: 98 U/L (ref 39–117)
Bilirubin, Direct: 0 mg/dL (ref 0.0–0.3)
Total Bilirubin: 0.4 mg/dL (ref 0.2–1.2)
Total Protein: 7.8 g/dL (ref 6.0–8.3)

## 2020-06-14 LAB — TSH: TSH: 1.85 u[IU]/mL (ref 0.35–4.50)

## 2020-06-14 LAB — VITAMIN D 25 HYDROXY (VIT D DEFICIENCY, FRACTURES): VITD: 28.1 ng/mL — ABNORMAL LOW (ref 30.00–100.00)

## 2020-06-14 MED ORDER — STEGLATRO 5 MG PO TABS: 5.0000 mg | ORAL_TABLET | Freq: Every day | ORAL | 0 refills | Status: DC

## 2020-06-14 MED ORDER — RYBELSUS 3 MG PO TABS
3.0000 mg | ORAL_TABLET | Freq: Every day | ORAL | 0 refills | Status: AC
Start: 2020-06-14 — End: 2020-07-14

## 2020-06-14 MED ORDER — ATORVASTATIN CALCIUM 40 MG PO TABS: 40.0000 mg | ORAL_TABLET | Freq: Every day | ORAL | 1 refills | Status: DC

## 2020-06-14 MED ORDER — TRIAMTERENE-HCTZ 37.5-25 MG PO CAPS: 1.0000 | ORAL_CAPSULE | Freq: Every day | ORAL | 0 refills | Status: DC

## 2020-06-14 MED ORDER — VALACYCLOVIR HCL 1 G PO TABS: 500.0000 mg | ORAL_TABLET | Freq: Every day | ORAL | 1 refills | Status: DC | PRN

## 2020-06-14 MED ORDER — CHOLECALCIFEROL 50 MCG (2000 UT) PO TABS: 1.0000 | ORAL_TABLET | Freq: Every day | ORAL | 3 refills | Status: AC

## 2020-06-14 MED ORDER — METFORMIN HCL ER 750 MG PO TB24: 750.0000 mg | ORAL_TABLET | Freq: Every day | ORAL | 1 refills | Status: DC

## 2020-06-14 NOTE — Telephone Encounter (Signed)
Pharmacy called and said that the patient does not want to take metFORMIN (GLUCOPHAGE-XR) 750 MG 24 hr tablet. She said that it messes with the patients stomach. She was wondering if Rybelsus could be called in instead. She was also wondering if a new order for fluticasone (FLONASE) 50 MCG/ACT nasal spray. It can be sent to [place], [place] - [address] Please advise

## 2020-06-14 NOTE — Progress Notes (Signed)
Subjective:  [name], female    DOB: September 14, 1960  Age: 60 y.o. MRN: [id number]  CC: Abdominal Pain, Diabetes, Hypertension, Hyperlipidemia, Back Pain, and Annual Exam  This visit occurred during the SARS-CoV-2 public health emergency.  Safety protocols were in place, including screening questions prior to the visit, additional usage of staff PPE, and extensive cleaning of exam room while observing appropriate contact time as indicated for disinfecting solutions.    HPI Lelania Bia presents for a CPX and f/up -   She complains of a 3 month hx of "sharp" right low back pain and intermittent LLQ abd pain. The pains increase with movement. She is status post hysterectomy about 6 months ago and is followed by GYN.  She complains of constipation but denies nausea, vomiting, fever, chills, loss of appetite, weight loss, dysuria, hematuria, melena, or bright red blood per rectum.  She does not monitor her blood sugar.  She denies polys.   Outpatient Medications Prior to Visit  Medication Sig Dispense Refill  . acetaminophen (TYLENOL) 500 MG tablet Take 2 tablets (1,000 mg total) by mouth every 6 (six) hours. 60 tablet 1  . Ascorbic Acid (VITAMIN C PO) Take 1 tablet by mouth daily.    Marland Kitchen esomeprazole (NEXIUM) 40 MG capsule TAKE 1 CAPSULE BY MOUTH EVERY DAY 90 capsule 1  . fluticasone (FLONASE) 50 MCG/ACT nasal spray SPRAY 2 SPRAYS INTO EACH NOSTRIL EVERY DAY (Patient taking differently: Place 2 sprays into both nostrils daily as needed for allergies.) 48 mL 1  . zinc gluconate 50 MG tablet Take 50 mg by mouth daily.    Marland Kitchen atorvastatin (LIPITOR) 10 MG tablet TAKE 1 TABLET BY MOUTH EVERY DAY 90 tablet 1  . Cholecalciferol 50 MCG (2000 UT) TABS Take 1 tablet (2,000 Units total) by mouth daily. 90 tablet 3  . ibuprofen (ADVIL) 800 MG tablet Take 800 mg by mouth every 8 (eight) hours as needed for moderate pain.    Marland Kitchen ibuprofen (ADVIL) 800 MG tablet Take 1 tablet (800 mg total) by mouth  every 8 (eight) hours. 60 tablet 1  . indapamide (LOZOL) 1.25 MG tablet TAKE 1 TABLET BY MOUTH EVERY DAY 90 tablet 0  . losartan (COZAAR) 50 MG tablet Take 50 mg by mouth daily.    . meclizine (ANTIVERT) 25 MG tablet Take 25 mg by mouth 3 (three) times daily as needed for dizziness.    . valACYclovir (VALTREX) 1000 MG tablet Take 1 tablet po daily for suppression. (Patient taking differently: Take 1,000 mg by mouth daily as needed (cold sores).) 30 tablet 11  . fexofenadine (ALLEGRA) 180 MG tablet Take 180 mg by mouth daily. (Patient not taking: Reported on 06/14/2020)    . metFORMIN (GLUCOPHAGE) 500 MG tablet Take 500 mg by mouth every morning. (Patient not taking: No sig reported)     No facility-administered medications prior to visit.    ROS Review of Systems  Constitutional: Positive for unexpected weight change. Negative for chills, diaphoresis and fever.  HENT: Negative.  Negative for trouble swallowing.   Eyes: Negative.   Respiratory: Negative for cough, chest tightness, shortness of breath and wheezing.   Cardiovascular: Negative for chest pain, palpitations and leg swelling.  Gastrointestinal: Positive for abdominal pain and constipation. Negative for abdominal distention, anal bleeding, blood in stool, diarrhea, nausea and vomiting.  Endocrine: Negative.  Negative for polydipsia, polyphagia and polyuria.  Genitourinary: Positive for vaginal pain. Negative for difficulty urinating, dysuria, flank pain, frequency, hematuria, pelvic pain,  vaginal bleeding and vaginal discharge.       She continues to complain of vulvodynia  Musculoskeletal: Positive for back pain. Negative for arthralgias, myalgias and neck pain.  Skin: Negative.   Neurological: Negative.  Negative for dizziness, weakness, light-headedness and headaches.  Hematological: Negative for adenopathy. Does not bruise/bleed easily.  Psychiatric/Behavioral: Negative.     Objective:  BP (!) 160/102 (BP Location: Left Arm,  Patient Position: Sitting, Cuff Size: Large)   Pulse 79   Temp 98.4 F (36.9 C) (Oral)   Resp 16   Ht 5\' 5"  (1.651 m)   Wt 210 lb 9.6 oz (95.5 kg)   LMP  (LMP Unknown)   SpO2 99%   BMI 35.05 kg/m   BP Readings from Last 3 Encounters:  06/14/20 (!) 160/102  11/01/19 139/65  10/28/19 (!) 150/92    Wt Readings from Last 3 Encounters:  06/14/20 210 lb 9.6 oz (95.5 kg)  10/31/19 205 lb 1.6 oz (93 kg)  10/28/19 203 lb 12.8 oz (92.4 kg)    Physical Exam Vitals reviewed.  Constitutional:      Appearance: She is well-developed.  HENT:     Nose: Nose normal.     Mouth/Throat:     Mouth: Mucous membranes are moist.  Eyes:     General: No scleral icterus.    Conjunctiva/sclera: Conjunctivae normal.  Cardiovascular:     Rate and Rhythm: Normal rate.     Heart sounds: No murmur heard. No friction rub. No gallop.      Comments: EKG- NSR, 68 bpm +artifact TWI in V1 is old No LVH or Q waves Pulmonary:     Effort: Pulmonary effort is normal.     Breath sounds: No stridor. No wheezing, rhonchi or rales.  Abdominal:     General: Abdomen is protuberant. Bowel sounds are normal. There is no distension.     Palpations: Abdomen is soft. There is no hepatomegaly, splenomegaly or mass.     Tenderness: There is abdominal tenderness in the left lower quadrant. There is no right CVA tenderness, left CVA tenderness, guarding or rebound.     Hernia: No hernia is present.  Musculoskeletal:        General: Normal range of motion.     Cervical back: Normal and neck supple.     Thoracic back: Normal.     Lumbar back: Normal. No edema, deformity, tenderness or bony tenderness. Normal range of motion. Negative right straight leg raise test and negative left straight leg raise test.     Right lower leg: No edema.     Left lower leg: No edema.  Lymphadenopathy:     Cervical: No cervical adenopathy.  Skin:    General: Skin is warm and dry.     Findings: No rash.  Neurological:     General:  No focal deficit present.     Mental Status: She is alert.  Psychiatric:        Mood and Affect: Mood normal.        Behavior: Behavior normal.     Lab Results  Component Value Date   WBC 5.2 06/14/2020   HGB 14.5 06/14/2020   HCT 43.4 06/14/2020   PLT 224.0 06/14/2020   GLUCOSE 148 (H) 06/14/2020   CHOL 186 06/14/2020   TRIG 100.0 06/14/2020   HDL 43.70 06/14/2020   LDLCALC 122 (H) 06/14/2020   ALT 24 06/14/2020   AST 22 06/14/2020   NA 140 06/14/2020   K 4.1 06/14/2020  CL 102 06/14/2020   CREATININE 0.96 06/14/2020   BUN 12 06/14/2020   CO2 30 06/14/2020   TSH 1.85 06/14/2020   HGBA1C 7.5 (H) 06/14/2020   MICROALBUR 2.2 (H) 06/14/2020    No results found.  Assessment & Plan:   Delorse was seen today for abdominal pain, diabetes, hypertension, hyperlipidemia, back pain and annual exam.  Diagnoses and all orders for this visit:  Essential hypertension- Her blood pressure is not adequately well controlled.  I will check her labs to screen for secondary causes and endorgan damage.  I recommended that she start taking the combination of triamterene and hydrochlorothiazide. -     CBC with Differential/Platelet; Future -     Basic metabolic panel; Future -     TSH; Future -     Urinalysis, Routine w reflex microscopic; Future -     Aldosterone + renin activity w/ ratio; Future -     triamterene-hydrochlorothiazide (DYAZIDE) 37.5-25 MG capsule; Take 1 each (1 capsule total) by mouth daily. -     Aldosterone + renin activity w/ ratio -     Urinalysis, Routine w reflex microscopic -     TSH -     Basic metabolic panel -     CBC with Differential/Platelet -     Cancel: EKG 12-Lead -     EKG 12-Lead  Hyperlipidemia with target LDL less than 130- She has not achieved her LDL goal. Will restart the statin. -     TSH; Future -     Hepatic function panel; Future -     Hepatic function panel -     TSH -     atorvastatin (LIPITOR) 40 MG tablet; Take 1 tablet (40 mg  total) by mouth daily.  Type II diabetes mellitus with manifestations ([place])- Her A1c is up to 7.5%.  She is not willing to take metformin because of a history of GI upset.  I recommended that she start taking an SGLT2 inhibitor and a GLP-1 agonist. -     Basic metabolic panel; Future -     Hemoglobin A1c; Future -     Microalbumin / creatinine urine ratio; Future -     HM Diabetes Foot Exam -     Ambulatory referral to Ophthalmology -     Semaglutide (RYBELSUS) 3 MG TABS; Take 3 mg by mouth daily. -     Microalbumin / creatinine urine ratio -     Hemoglobin A1c -     Basic metabolic panel -     Discontinue: metFORMIN (GLUCOPHAGE-XR) 750 MG 24 hr tablet; Take 1 tablet (750 mg total) by mouth daily with breakfast. -     ertugliflozin L-PyroglutamicAc (STEGLATRO) 5 MG TABS tablet; Take 1 tablet (5 mg total) by mouth daily.  Vitamin D deficiency disease -     VITAMIN D 25 Hydroxy (Vit-D Deficiency, Fractures); Future -     VITAMIN D 25 Hydroxy (Vit-D Deficiency, Fractures) -     Cholecalciferol 50 MCG (2000 UT) TABS; Take 1 tablet (2,000 Units total) by mouth daily.  Routine general medical examination at a health care facility- Exam completed, labs reviewed, vaccines reviewed - she refused a pneumonia vaccine, cancer screenings addressed, patient education was given. -     Lipid panel; Future -     Lipid panel  Herpes simplex vulvovaginitis- She will cont seeing GYN. -     valACYclovir (VALTREX) 1000 MG tablet; Take 0.5 tablets (500 mg total) by mouth daily as  needed (cold sores).  Chronic right-sided low back pain without sciatica- Will check plain films. -     DG Lumbar Spine Complete; Future -     CT Abdomen Pelvis W Contrast; Future  Left lower quadrant abdominal tenderness without rebound tenderness- Will check a CT scan with contrast to see if there is a mass, hernia, or malignancy. -     CT Abdomen Pelvis W Contrast; Future   I have discontinued Taitum Sissel's losartan,  metFORMIN, indapamide, ibuprofen, meclizine, ibuprofen, atorvastatin, and metFORMIN. I have also changed her valACYclovir. Additionally, I am having her start on triamterene-hydrochlorothiazide, Rybelsus, atorvastatin, and Steglatro. Lastly, I am having her maintain her fexofenadine, fluticasone, zinc gluconate, Ascorbic Acid (VITAMIN C PO), acetaminophen, esomeprazole, and Cholecalciferol.  Meds ordered this encounter  Medications  . valACYclovir (VALTREX) 1000 MG tablet    Sig: Take 0.5 tablets (500 mg total) by mouth daily as needed (cold sores).    Dispense:  90 tablet    Refill:  1  . triamterene-hydrochlorothiazide (DYAZIDE) 37.5-25 MG capsule    Sig: Take 1 each (1 capsule total) by mouth daily.    Dispense:  90 capsule    Refill:  0  . Semaglutide (RYBELSUS) 3 MG TABS    Sig: Take 3 mg by mouth daily.    Dispense:  30 tablet    Refill:  0    <SAMPLE>  . Cholecalciferol 50 MCG (2000 UT) TABS    Sig: Take 1 tablet (2,000 Units total) by mouth daily.    Dispense:  90 tablet    Refill:  3  . DISCONTD: metFORMIN (GLUCOPHAGE-XR) 750 MG 24 hr tablet    Sig: Take 1 tablet (750 mg total) by mouth daily with breakfast.    Dispense:  90 tablet    Refill:  1  . atorvastatin (LIPITOR) 40 MG tablet    Sig: Take 1 tablet (40 mg total) by mouth daily.    Dispense:  90 tablet    Refill:  1  . ertugliflozin L-PyroglutamicAc (STEGLATRO) 5 MG TABS tablet    Sig: Take 1 tablet (5 mg total) by mouth daily.    Dispense:  90 tablet    Refill:  0     Follow-up: Return in about 6 weeks (around 07/26/2020).  [name], MD

## 2020-06-14 NOTE — Patient Instructions (Signed)

## 2020-06-15 ENCOUNTER — Telehealth: Payer: Self-pay | Admitting: Internal Medicine

## 2020-06-15 LAB — URINALYSIS, ROUTINE W REFLEX MICROSCOPIC
Bilirubin Urine: NEGATIVE
Ketones, ur: NEGATIVE
Leukocytes,Ua: NEGATIVE
Nitrite: NEGATIVE
Specific Gravity, Urine: 1.015 (ref 1.000–1.030)
Total Protein, Urine: NEGATIVE
Urine Glucose: NEGATIVE
Urobilinogen, UA: 0.2 (ref 0.0–1.0)
pH: 7 (ref 5.0–8.0)

## 2020-06-15 LAB — MICROALBUMIN / CREATININE URINE RATIO
Creatinine,U: 152.9 mg/dL
Microalb Creat Ratio: 1.4 mg/g (ref 0.0–30.0)
Microalb, Ur: 2.2 mg/dL — ABNORMAL HIGH (ref 0.0–1.9)

## 2020-06-15 NOTE — Telephone Encounter (Signed)
ertugliflozin L-PyroglutamicAc (STEGLATRO) 5 MG TABS tablet   [facility] has called saying the insurance is requesting a prior authorization for the medication

## 2020-06-17 ENCOUNTER — Telehealth: Payer: Self-pay

## 2020-06-17 ENCOUNTER — Encounter: Payer: Self-pay | Admitting: Internal Medicine

## 2020-06-17 NOTE — Telephone Encounter (Signed)
PA UZR:[id number]  Request sent to plan, waiting for decision.   Will be notified via fax

## 2020-06-22 LAB — ALDOSTERONE + RENIN ACTIVITY W/ RATIO
ALDO / PRA Ratio: 0.8 Ratio — ABNORMAL LOW (ref 0.9–28.9)
Aldosterone: 4 ng/dL
Renin Activity: 4.73 ng/mL/h (ref 0.25–5.82)

## 2020-06-29 ENCOUNTER — Other Ambulatory Visit: Payer: Self-pay | Admitting: Internal Medicine

## 2020-06-29 ENCOUNTER — Telehealth: Payer: Self-pay

## 2020-06-29 NOTE — Telephone Encounter (Signed)
PA KZL:[id number]  PA for ertugliflozin L-PyroglutamicAc (STEGLATRO) 5 MG TABS tablet was denied.

## 2020-07-02 ENCOUNTER — Telehealth: Payer: Self-pay | Admitting: Internal Medicine

## 2020-07-02 NOTE — Telephone Encounter (Signed)
Patient called to obtain the information for her eye doctors appointment that Dr. Ronnald Ramp referred her to. She can be reached at [phone]. Please advise

## 2020-07-15 ENCOUNTER — Ambulatory Visit
Admission: RE | Admit: 2020-07-15 | Discharge: 2020-07-15 | Disposition: A | Discharge status: Home / Self Care | Payer: 59 | Source: Ambulatory Visit | Attending: Internal Medicine | Admitting: Internal Medicine

## 2020-07-15 DIAGNOSIS — M545 Low back pain, unspecified: Secondary | ICD-10-CM

## 2020-07-15 DIAGNOSIS — G8929 Other chronic pain: Secondary | ICD-10-CM

## 2020-07-15 DIAGNOSIS — R10814 Left lower quadrant abdominal tenderness: Secondary | ICD-10-CM

## 2020-07-15 MED ORDER — IOPAMIDOL (ISOVUE-300) INJECTION 61%
100.0000 mL | Freq: Once | INTRAVENOUS | Status: AC | PRN
  Administered 2020-07-15: 100 mL via INTRAVENOUS

## 2020-07-17 ENCOUNTER — Encounter: Payer: Self-pay | Admitting: Internal Medicine

## 2020-07-19 ENCOUNTER — Telehealth: Payer: Self-pay | Admitting: Internal Medicine

## 2020-07-19 NOTE — Telephone Encounter (Signed)
Noted  

## 2020-07-19 NOTE — Telephone Encounter (Signed)
CT call report [facility] Radiology:  There is a 6 mm ground-glass nodule in the right lung as described in the findings section of the report. Initial follow-up with CT at 6-12 months is recommended to confirm persistence. If persistent, repeat CT is recommended every 2 years until 5 years of stability has been established.

## 2020-07-19 NOTE — Telephone Encounter (Signed)
Letter sent to her Will recheck in 6 months

## 2020-08-24 LAB — HM DIABETES EYE EXAM

## 2020-08-26 ENCOUNTER — Other Ambulatory Visit: Payer: Self-pay | Admitting: Internal Medicine

## 2020-08-26 DIAGNOSIS — K2100 Gastro-esophageal reflux disease with esophagitis, without bleeding: Secondary | ICD-10-CM

## 2020-08-26 DIAGNOSIS — E785 Hyperlipidemia, unspecified: Secondary | ICD-10-CM

## 2020-08-26 DIAGNOSIS — I10 Essential (primary) hypertension: Secondary | ICD-10-CM

## 2020-11-22 ENCOUNTER — Telehealth: Payer: Self-pay | Admitting: Internal Medicine

## 2020-11-22 DIAGNOSIS — I10 Essential (primary) hypertension: Secondary | ICD-10-CM

## 2020-12-03 NOTE — Telephone Encounter (Signed)
   Patient calling to request short supply of triamterene-hydrochlorothiazide  until she can be seen  11/21

## 2020-12-04 ENCOUNTER — Other Ambulatory Visit: Payer: Self-pay | Admitting: Internal Medicine

## 2020-12-04 DIAGNOSIS — I10 Essential (primary) hypertension: Secondary | ICD-10-CM

## 2020-12-04 MED ORDER — TRIAMTERENE-HCTZ 37.5-25 MG PO CAPS: ORAL_CAPSULE | ORAL | 0 refills | Status: DC

## 2020-12-10 LAB — HM MAMMOGRAPHY: HM Mammogram: NORMAL (ref 0–4)

## 2021-01-03 ENCOUNTER — Other Ambulatory Visit: Payer: Self-pay | Admitting: Internal Medicine

## 2021-01-03 DIAGNOSIS — R911 Solitary pulmonary nodule: Secondary | ICD-10-CM

## 2021-01-07 ENCOUNTER — Telehealth: Payer: Self-pay | Admitting: Internal Medicine

## 2021-01-07 NOTE — Telephone Encounter (Signed)
Pt was informed that CT was a follow up for the scan done on 07/17/20. However, she stated that she declines to do CT at this time.

## 2021-01-07 NOTE — Telephone Encounter (Signed)
Patient states she was unaware of the reason for a ct scan schedule for 01-12-2021  Patient is requesting a call back to discuss ct scan

## 2021-01-12 ENCOUNTER — Inpatient Hospital Stay: Admission: RE | Admit: 2021-01-12 | Payer: 59 | Source: Ambulatory Visit

## 2021-01-17 ENCOUNTER — Encounter: Payer: Self-pay | Admitting: Internal Medicine

## 2021-01-17 ENCOUNTER — Ambulatory Visit (INDEPENDENT_AMBULATORY_CARE_PROVIDER_SITE_OTHER): Payer: 59 | Admitting: Internal Medicine

## 2021-01-17 ENCOUNTER — Other Ambulatory Visit: Payer: Self-pay

## 2021-01-17 VITALS — BP 142/84 | HR 98 | Temp 98.6°F | Resp 16 | Wt 210.4 lb

## 2021-01-17 DIAGNOSIS — E118 Type 2 diabetes mellitus with unspecified complications: Secondary | ICD-10-CM | POA: Diagnosis not present

## 2021-01-17 DIAGNOSIS — Z794 Long term (current) use of insulin: Secondary | ICD-10-CM

## 2021-01-17 DIAGNOSIS — E119 Type 2 diabetes mellitus without complications: Secondary | ICD-10-CM | POA: Diagnosis not present

## 2021-01-17 DIAGNOSIS — I10 Essential (primary) hypertension: Secondary | ICD-10-CM

## 2021-01-17 DIAGNOSIS — Z23 Encounter for immunization: Secondary | ICD-10-CM

## 2021-01-17 DIAGNOSIS — T50905A Adverse effect of unspecified drugs, medicaments and biological substances, initial encounter: Secondary | ICD-10-CM

## 2021-01-17 LAB — CBC WITH DIFFERENTIAL/PLATELET
Basophils Absolute: 0 10*3/uL (ref 0.0–0.1)
Basophils Relative: 0.3 % (ref 0.0–3.0)
Eosinophils Absolute: 0 10*3/uL (ref 0.0–0.7)
Eosinophils Relative: 0.6 % (ref 0.0–5.0)
HCT: 44.1 % (ref 36.0–46.0)
Hemoglobin: 14.9 g/dL (ref 12.0–15.0)
Lymphocytes Relative: 32.7 % (ref 12.0–46.0)
Lymphs Abs: 2.2 10*3/uL (ref 0.7–4.0)
MCHC: 33.8 g/dL (ref 30.0–36.0)
MCV: 92 fl (ref 78.0–100.0)
Monocytes Absolute: 0.4 10*3/uL (ref 0.1–1.0)
Monocytes Relative: 5.7 % (ref 3.0–12.0)
Neutro Abs: 4.1 10*3/uL (ref 1.4–7.7)
Neutrophils Relative %: 60.7 % (ref 43.0–77.0)
Platelets: 214 10*3/uL (ref 150.0–400.0)
RBC: 4.79 Mil/uL (ref 3.87–5.11)
RDW: 14 % (ref 11.5–15.5)
WBC: 6.7 10*3/uL (ref 4.0–10.5)

## 2021-01-17 LAB — HEMOGLOBIN A1C: Hgb A1c MFr Bld: 10.7 % — ABNORMAL HIGH (ref 4.6–6.5)

## 2021-01-17 MED ORDER — CARVEDILOL 3.125 MG PO TABS: 3.1250 mg | ORAL_TABLET | Freq: Two times a day (BID) | ORAL | 0 refills | Status: DC

## 2021-01-17 NOTE — Patient Instructions (Signed)

## 2021-01-17 NOTE — Progress Notes (Signed)
Subjective:  [name], female    DOB: [birth year]  Age: 60 y.o. MRN: [id number]  CC: Hypertension and Diabetes  This visit occurred during the SARS-CoV-2 public health emergency.  Safety protocols were in place, including screening questions prior to the visit, additional usage of staff PPE, and extensive cleaning of exam room while observing appropriate contact time as indicated for disinfecting solutions.    HPI Kathryn Parks presents for f/up -   She tells me her blood pressure has been well controlled.  She denies headache, blurred vision, chest pain, dyspnea on exertion, edema, or fatigue.  She does not monitor her blood sugar but she denies polys.  Outpatient Medications Prior to Visit  Medication Sig Dispense Refill   acetaminophen (TYLENOL) 500 MG tablet Take 2 tablets (1,000 mg total) by mouth every 6 (six) hours. 60 tablet 1   amitriptyline (ELAVIL) 75 MG tablet Take 75 mg by mouth daily. Take 1 by mouth daily     Ascorbic Acid (VITAMIN C PO) Take 1 tablet by mouth daily.     atorvastatin (LIPITOR) 40 MG tablet TAKE 1 TABLET BY MOUTH EVERY DAY 90 tablet 1   Cholecalciferol 50 MCG (2000 UT) TABS Take 1 tablet (2,000 Units total) by mouth daily. 90 tablet 3   esomeprazole (NEXIUM) 40 MG capsule TAKE 1 CAPSULE BY MOUTH EVERY DAY 90 capsule 1   fexofenadine (ALLEGRA) 180 MG tablet Take 180 mg by mouth daily.     fluticasone (FLONASE) 50 MCG/ACT nasal spray SPRAY 2 SPRAYS INTO EACH NOSTRIL EVERY DAY (Patient taking differently: Place 2 sprays into both nostrils daily as needed for allergies.) 48 mL 1   gabapentin (NEURONTIN) 100 MG capsule Take 200 mg by mouth 3 (three) times daily. Take 200 mg by mouth 3 (three) times daily.     triamterene-hydrochlorothiazide (DYAZIDE) 37.5-25 MG capsule TAKE 1 CAPSULE BY MOUTH EVERY DAY 90 capsule 0   valACYclovir (VALTREX) 1000 MG tablet Take 0.5 tablets (500 mg total) by mouth daily as needed (cold sores). 90 tablet 1   zinc  gluconate 50 MG tablet Take 50 mg by mouth daily.     No facility-administered medications prior to visit.    ROS Review of Systems  Constitutional:  Negative for diaphoresis, fatigue and unexpected weight change.  HENT: Negative.    Eyes: Negative.   Respiratory:  Negative for cough, chest tightness, shortness of breath and wheezing.   Cardiovascular:  Negative for chest pain, palpitations and leg swelling.  Gastrointestinal:  Negative for abdominal pain, constipation, diarrhea and vomiting.  Endocrine: Negative.   Genitourinary: Negative.  Negative for difficulty urinating.  Musculoskeletal: Negative.  Negative for arthralgias and myalgias.  Skin: Negative.   Neurological:  Negative for dizziness, weakness, light-headedness and headaches.  Hematological:  Negative for adenopathy. Does not bruise/bleed easily.  Psychiatric/Behavioral: Negative.     Objective:  BP (!) 142/84 (BP Location: Right Arm, Patient Position: Sitting, Cuff Size: Large)   Pulse 98   Temp 98.6 F (37 C) (Oral)   Resp 16   Wt 210 lb 6.4 oz (95.4 kg)   LMP  (LMP Unknown)   SpO2 94%   BMI 35.01 kg/m   BP Readings from Last 3 Encounters:  01/17/21 (!) 142/84  06/14/20 (!) 160/102  11/01/19 139/65    Wt Readings from Last 3 Encounters:  01/17/21 210 lb 6.4 oz (95.4 kg)  06/14/20 210 lb 9.6 oz (95.5 kg)  10/31/19 205 lb 1.6 oz (93 kg)  Physical Exam Vitals reviewed.  HENT:     Nose: Nose normal.     Mouth/Throat:     Mouth: Mucous membranes are moist.  Eyes:     Conjunctiva/sclera: Conjunctivae normal.  Cardiovascular:     Rate and Rhythm: Normal rate and regular rhythm.     Heart sounds: No murmur heard. Pulmonary:     Effort: Pulmonary effort is normal.     Breath sounds: No stridor. No wheezing, rhonchi or rales.  Abdominal:     General: Abdomen is flat.     Palpations: There is no mass.     Tenderness: There is no abdominal tenderness. There is no guarding.     Hernia: No hernia is  present.  Musculoskeletal:        General: Normal range of motion.     Cervical back: Neck supple.     Right lower leg: No edema.     Left lower leg: No edema.  Lymphadenopathy:     Cervical: No cervical adenopathy.  Skin:    General: Skin is warm and dry.  Neurological:     General: No focal deficit present.     Mental Status: She is alert.  Psychiatric:        Mood and Affect: Mood normal.        Behavior: Behavior normal.    Lab Results  Component Value Date   WBC 6.7 01/17/2021   HGB 14.9 01/17/2021   HCT 44.1 01/17/2021   PLT 214.0 01/17/2021   GLUCOSE 310 (H) 01/17/2021   CHOL 186 06/14/2020   TRIG 100.0 06/14/2020   HDL 43.70 06/14/2020   LDLCALC 122 (H) 06/14/2020   ALT 24 06/14/2020   AST 22 06/14/2020   NA 135 01/17/2021   K 4.0 01/17/2021   CL 96 01/17/2021   CREATININE 1.09 01/17/2021   BUN 14 01/17/2021   CO2 29 01/17/2021   TSH 1.85 06/14/2020   HGBA1C 10.7 (H) 01/17/2021   MICROALBUR 2.2 (H) 06/14/2020    CT Abdomen Pelvis W Contrast  Addendum Date: 07/17/2020   ADDENDUM REPORT: 07/17/2020 07:21 ADDENDUM: There is a 6 mm ground-glass nodule in the right lung as described in the findings section of the report. Initial follow-up with CT at 6-12 months is recommended to confirm persistence. If persistent, repeat CT is recommended every 2 years until 5 years of stability has been established. This recommendation follows the consensus statement: Guidelines for Management of Incidental Pulmonary Nodules Detected on CT Images: From the Fleischner Society 2017; Radiology 2017; 284:228-243. These results will be called to the ordering clinician or representative by the Radiologist Assistant, and communication documented in the PACS or [employer]. Electronically Signed   By: Dorise Bullion III M.D   On: 07/17/2020 07:21   Result Date: 07/17/2020 CLINICAL DATA:  Acute nonlocalized abdominal pain. Chronic right-sided low pain. Lower abdominal for 3 years. EXAM:  CT ABDOMEN AND PELVIS WITH CONTRAST TECHNIQUE: Multidetector CT imaging of the abdomen and pelvis was performed using the standard protocol following bolus administration of intravenous contrast. CONTRAST:  136mL ISOVUE-300 IOPAMIDOL (ISOVUE-300) INJECTION 61% COMPARISON:  January 27, 2019 FINDINGS: Lower chest: There is a ground-glass in the right lung base on series 5, image 22 measuring 6 mm no other suspicious nodules. No masses or infiltrates no other abnormalities in the lower chest. Hepatobiliary: Hepatic steatosis is identified. Gallbladder is normal. Portal vein is patent. Pancreas: Unremarkable. No pancreatic ductal dilatation or surrounding inflammatory changes. Spleen: Normal in size without  focal abnormality. Adrenals/Urinary Tract: The known adrenal adenoma is stable. Adrenal glands are all otherwise normal few tiny low-attenuation lesions the kidneys too small to characterize likely cysts. No suspicious masses, hydronephrosis, or perinephric stranding. No ureterectasis or ureteral stones. The bladder is normal. Stomach/Bowel: The stomach is unremarkable. The wall the second portion of the duodenum is mildly adjacent fat stranding. This is likely due to lack distention. The distal esophagus stomach small bowel, colon and visualized appendix are otherwise normal Vascular/Lymphatic: No significant vascular findings are present. No enlarged abdominal or pelvic lymph nodes. Reproductive: Status post hysterectomy. No adnexal masses. Other: No abdominal wall hernia or abnormality. No abdominopelvic ascites. Musculoskeletal: No acute or significant osseous findings. IMPRESSION: 1. No acute abnormalities are identified. No cause for the patient's abdominal pain noted. 2. There is a 6 mm ground-glass nodule in the right lung as above. 3. Hepatic steatosis. 4. Known left adrenal adenoma. 5. No other acute abnormalities. Electronically Signed: By: Dorise Bullion III M.D On: 07/16/2020 21:28    Assessment &  Plan:   Elyanah was seen today for hypertension and diabetes.  Diagnoses and all orders for this visit:  Essential hypertension- Her blood pressure is not adequately well controlled.  Will add carvedilol to the diuretics. -     CBC with Differential/Platelet; Future -     Basic metabolic panel; Future -     carvedilol (COREG) 3.125 MG tablet; Take 1 tablet (3.125 mg total) by mouth 2 (two) times daily with a meal. -     Basic metabolic panel -     CBC with Differential/Platelet  Type II diabetes mellitus with manifestations ([place])- Her blood sugar is not adequately well controlled. -     Basic metabolic panel; Future -     Hemoglobin A1c; Future -     Hemoglobin A1c -     Basic metabolic panel  Need for vaccination -     Pneumococcal conjugate vaccine 20-valent (Prevnar 20)  Insulin-requiring or dependent type II diabetes mellitus ([place])- Her A1c is up to 10.7%.  She does not tolerate metformin.  I recommended that she start using basal insulin and a GLP-1 agonist. -     Insulin Glargine-Lixisenatide (SOLIQUA) 100-33 UNT-MCG/ML SOPN; Inject 60 Units into the skin daily. -     Continuous Blood Gluc Sensor (FREESTYLE LIBRE 2 SENSOR) MISC; 1 Act by Does not apply route daily. -     Continuous Blood Gluc Receiver (FREESTYLE LIBRE 2 READER) DEVI; 1 Act by Does not apply route daily. -     Insulin Pen Needle 32G X 6 MM MISC; 1 Act by Does not apply route daily. -     Ambulatory referral to Endocrinology -     Amb Referral to Nutrition and Diabetic Education  Hypercalcemia due to a drug- She is asymptomatic with this.  This is likely caused by hydrochlorothiazide.  Will continue to follow.  I am having Kathryn Parks start on carvedilol, Soliqua, FreeStyle Libre 2 Sensor, [employer] 2 Reader, and Insulin Pen Needle. I am also having her maintain her fexofenadine, fluticasone, zinc gluconate, Ascorbic Acid (VITAMIN C PO), acetaminophen, valACYclovir, Cholecalciferol, esomeprazole,  atorvastatin, triamterene-hydrochlorothiazide, amitriptyline, and gabapentin.  Meds ordered this encounter  Medications   carvedilol (COREG) 3.125 MG tablet    Sig: Take 1 tablet (3.125 mg total) by mouth 2 (two) times daily with a meal.    Dispense:  180 tablet    Refill:  0   Insulin Glargine-Lixisenatide (SOLIQUA) 100-33 UNT-MCG/ML  SOPN    Sig: Inject 60 Units into the skin daily.    Dispense:  54 mL    Refill:  1   Continuous Blood Gluc Sensor (FREESTYLE LIBRE 2 SENSOR) MISC    Sig: 1 Act by Does not apply route daily.    Dispense:  2 each    Refill:  5   Continuous Blood Gluc Receiver (FREESTYLE LIBRE 2 READER) DEVI    Sig: 1 Act by Does not apply route daily.    Dispense:  2 each    Refill:  5   Insulin Pen Needle 32G X 6 MM MISC    Sig: 1 Act by Does not apply route daily.    Dispense:  100 each    Refill:  1      Follow-up: Return in about 3 months (around 04/19/2021).  [name], MD

## 2021-01-18 DIAGNOSIS — Z794 Long term (current) use of insulin: Secondary | ICD-10-CM | POA: Insufficient documentation

## 2021-01-18 DIAGNOSIS — Z23 Encounter for immunization: Secondary | ICD-10-CM | POA: Insufficient documentation

## 2021-01-18 DIAGNOSIS — E119 Type 2 diabetes mellitus without complications: Secondary | ICD-10-CM | POA: Insufficient documentation

## 2021-01-18 DIAGNOSIS — T50905A Adverse effect of unspecified drugs, medicaments and biological substances, initial encounter: Secondary | ICD-10-CM | POA: Insufficient documentation

## 2021-01-18 LAB — BASIC METABOLIC PANEL
BUN: 14 mg/dL (ref 6–23)
CO2: 29 mEq/L (ref 19–32)
Calcium: 10.6 mg/dL — ABNORMAL HIGH (ref 8.4–10.5)
Chloride: 96 mEq/L (ref 96–112)
Creatinine, Ser: 1.09 mg/dL (ref 0.40–1.20)
GFR: 54.73 mL/min — ABNORMAL LOW (ref >60.00)
Glucose, Bld: 310 mg/dL — ABNORMAL HIGH (ref 70–99)
Potassium: 4 mEq/L (ref 3.5–5.1)
Sodium: 135 mEq/L (ref 135–145)

## 2021-01-18 MED ORDER — SOLIQUA 100-33 UNT-MCG/ML ~~LOC~~ SOPN: 60.0000 [IU] | PEN_INJECTOR | Freq: Every day | SUBCUTANEOUS | 1 refills | Status: DC

## 2021-01-18 MED ORDER — FREESTYLE LIBRE 2 SENSOR MISC: 1.0000 | Freq: Every day | 5 refills | Status: DC

## 2021-01-18 MED ORDER — FREESTYLE LIBRE 2 READER DEVI: 1.0000 | Freq: Every day | 5 refills | Status: DC

## 2021-01-18 MED ORDER — INSULIN PEN NEEDLE 32G X 6 MM MISC: 1.0000 | Freq: Every day | 1 refills | Status: DC

## 2021-02-15 ENCOUNTER — Other Ambulatory Visit: Payer: Self-pay | Admitting: Internal Medicine

## 2021-02-15 DIAGNOSIS — I10 Essential (primary) hypertension: Secondary | ICD-10-CM

## 2021-02-19 ENCOUNTER — Other Ambulatory Visit: Payer: Self-pay | Admitting: Internal Medicine

## 2021-02-19 DIAGNOSIS — K2100 Gastro-esophageal reflux disease with esophagitis, without bleeding: Secondary | ICD-10-CM

## 2021-02-19 DIAGNOSIS — A6004 Herpesviral vulvovaginitis: Secondary | ICD-10-CM

## 2021-02-19 DIAGNOSIS — E785 Hyperlipidemia, unspecified: Secondary | ICD-10-CM

## 2021-04-21 ENCOUNTER — Other Ambulatory Visit: Payer: Self-pay | Admitting: Internal Medicine

## 2021-04-21 DIAGNOSIS — I10 Essential (primary) hypertension: Secondary | ICD-10-CM

## 2021-06-01 ENCOUNTER — Other Ambulatory Visit: Payer: Self-pay | Admitting: Internal Medicine

## 2021-06-01 ENCOUNTER — Telehealth: Payer: Self-pay | Admitting: Internal Medicine

## 2021-06-01 DIAGNOSIS — K2100 Gastro-esophageal reflux disease with esophagitis, without bleeding: Secondary | ICD-10-CM

## 2021-06-01 MED ORDER — PANTOPRAZOLE SODIUM 40 MG PO TBEC: 40.0000 mg | DELAYED_RELEASE_TABLET | Freq: Every day | ORAL | 1 refills | Status: DC

## 2021-06-01 NOTE — Telephone Encounter (Signed)
Key: [id number] ? ?Per CoverMyMeds [place] was denied.  ? ?Reason given: ? ?[employer] Rx Prior Authorization team is unable to review this product for a coverage determination as the requested medication is not on the patient's formulary.  ?NOTE ALT: omeprazole, pantoprazole, lansoprazole. ? ?

## 2021-06-01 NOTE — Telephone Encounter (Signed)
Pharmacy states PA is needed for esomeprazole (NEXIUM) 40 MG capsule ? ?The covered alternatives are omeprazole and protonix ?

## 2021-07-04 ENCOUNTER — Encounter: Payer: Self-pay | Admitting: Internal Medicine

## 2021-07-04 ENCOUNTER — Ambulatory Visit (INDEPENDENT_AMBULATORY_CARE_PROVIDER_SITE_OTHER): Payer: 59 | Admitting: Internal Medicine

## 2021-07-04 ENCOUNTER — Other Ambulatory Visit: Payer: Self-pay | Admitting: Internal Medicine

## 2021-07-04 VITALS — BP 142/96 | HR 94 | Temp 98.2°F | Resp 16 | Ht 65.0 in | Wt 202.0 lb

## 2021-07-04 DIAGNOSIS — Z794 Long term (current) use of insulin: Secondary | ICD-10-CM

## 2021-07-04 DIAGNOSIS — Z124 Encounter for screening for malignant neoplasm of cervix: Secondary | ICD-10-CM | POA: Diagnosis not present

## 2021-07-04 DIAGNOSIS — I10 Essential (primary) hypertension: Secondary | ICD-10-CM

## 2021-07-04 DIAGNOSIS — N1831 Chronic kidney disease, stage 3a: Secondary | ICD-10-CM

## 2021-07-04 DIAGNOSIS — R918 Other nonspecific abnormal finding of lung field: Secondary | ICD-10-CM

## 2021-07-04 DIAGNOSIS — R911 Solitary pulmonary nodule: Secondary | ICD-10-CM

## 2021-07-04 DIAGNOSIS — E119 Type 2 diabetes mellitus without complications: Secondary | ICD-10-CM | POA: Diagnosis not present

## 2021-07-04 DIAGNOSIS — E785 Hyperlipidemia, unspecified: Secondary | ICD-10-CM | POA: Diagnosis not present

## 2021-07-04 LAB — LIPID PANEL
Cholesterol: 183 mg/dL (ref 0–200)
HDL: 42 mg/dL (ref >39.00)
LDL Cholesterol: 118 mg/dL — ABNORMAL HIGH (ref 0–99)
NonHDL: 140.78
Total CHOL/HDL Ratio: 4
Triglycerides: 113 mg/dL (ref 0.0–149.0)
VLDL: 22.6 mg/dL (ref 0.0–40.0)

## 2021-07-04 LAB — BASIC METABOLIC PANEL
BUN: 14 mg/dL (ref 6–23)
CO2: 27 mEq/L (ref 19–32)
Calcium: 9.3 mg/dL (ref 8.4–10.5)
Chloride: 94 mEq/L — ABNORMAL LOW (ref 96–112)
Creatinine, Ser: 1.2 mg/dL (ref 0.40–1.20)
GFR: 48.61 mL/min — ABNORMAL LOW (ref >60.00)
Glucose, Bld: 297 mg/dL — ABNORMAL HIGH (ref 70–99)
Potassium: 3.5 mEq/L (ref 3.5–5.1)
Sodium: 133 mEq/L — ABNORMAL LOW (ref 135–145)

## 2021-07-04 LAB — URINALYSIS, ROUTINE W REFLEX MICROSCOPIC
Bilirubin Urine: NEGATIVE
Hgb urine dipstick: NEGATIVE
Ketones, ur: 15 — AB
Leukocytes,Ua: NEGATIVE
Nitrite: NEGATIVE
RBC / HPF: NONE SEEN (ref 0–?)
Specific Gravity, Urine: 1.01 (ref 1.000–1.030)
Total Protein, Urine: NEGATIVE
Urine Glucose: >=1000 — AB
Urobilinogen, UA: 0.2 (ref 0.0–1.0)
pH: 7 (ref 5.0–8.0)

## 2021-07-04 LAB — HEMOGLOBIN A1C: Hgb A1c MFr Bld: 12.7 % — ABNORMAL HIGH (ref 4.6–6.5)

## 2021-07-04 LAB — HEPATIC FUNCTION PANEL
ALT: 100 U/L — ABNORMAL HIGH (ref 0–35)
AST: 117 U/L — ABNORMAL HIGH (ref 0–37)
Albumin: 4.3 g/dL (ref 3.5–5.2)
Alkaline Phosphatase: 106 U/L (ref 39–117)
Bilirubin, Direct: 0.1 mg/dL (ref 0.0–0.3)
Total Bilirubin: 0.5 mg/dL (ref 0.2–1.2)
Total Protein: 8 g/dL (ref 6.0–8.3)

## 2021-07-04 LAB — MICROALBUMIN / CREATININE URINE RATIO
Creatinine,U: 86.2 mg/dL
Microalb Creat Ratio: 1 mg/g (ref 0.0–30.0)
Microalb, Ur: 0.9 mg/dL (ref 0.0–1.9)

## 2021-07-04 MED ORDER — SOLIQUA 100-33 UNT-MCG/ML ~~LOC~~ SOPN: 60.0000 [IU] | PEN_INJECTOR | Freq: Every day | SUBCUTANEOUS | 1 refills | Status: DC

## 2021-07-04 MED ORDER — TRIAMTERENE-HCTZ 37.5-25 MG PO CAPS: 1.0000 | ORAL_CAPSULE | Freq: Every day | ORAL | 0 refills | Status: DC

## 2021-07-04 MED ORDER — INSULIN PEN NEEDLE 32G X 6 MM MISC: 1.0000 | Freq: Every day | 1 refills | Status: DC

## 2021-07-04 MED ORDER — CARVEDILOL 3.125 MG PO TABS: 3.1250 mg | ORAL_TABLET | Freq: Two times a day (BID) | ORAL | 1 refills | Status: DC

## 2021-07-04 NOTE — Patient Instructions (Signed)

## 2021-07-04 NOTE — Progress Notes (Signed)
? ?Subjective:  ?[name], female    DOB: [birth year]  Age: 61 y.o. MRN: [id number] ? ?CC: Hypertension and Diabetes ? ? ?HPI ?Kathryn Parks presents for f/up - ? ?She is not using Soliqua or taking carvedilol.  She has been walking and denies chest pain, shortness of breath, diaphoresis, dizziness, lightheadedness, or edema. ? ?Outpatient Medications Prior to Visit  ?Medication Sig Dispense Refill  ? acetaminophen (TYLENOL) 500 MG tablet Take 2 tablets (1,000 mg total) by mouth every 6 (six) hours. 60 tablet 1  ? amitriptyline (ELAVIL) 75 MG tablet Take 75 mg by mouth daily. Take 1 by mouth daily    ? atorvastatin (LIPITOR) 40 MG tablet TAKE 1 TABLET BY MOUTH EVERY DAY 90 tablet 1  ? Cholecalciferol 50 MCG (2000 UT) TABS Take 1 tablet (2,000 Units total) by mouth daily. 90 tablet 3  ? Continuous Blood Gluc Receiver (FREESTYLE LIBRE 2 READER) DEVI 1 Act by Does not apply route daily. 2 each 5  ? Continuous Blood Gluc Sensor (FREESTYLE LIBRE 2 SENSOR) MISC 1 Act by Does not apply route daily. 2 each 5  ? fexofenadine (ALLEGRA) 180 MG tablet Take 180 mg by mouth daily.    ? fluticasone (FLONASE) 50 MCG/ACT nasal spray SPRAY 2 SPRAYS INTO EACH NOSTRIL EVERY DAY (Patient taking differently: Place 2 sprays into both nostrils daily as needed for allergies.) 48 mL 1  ? gabapentin (NEURONTIN) 100 MG capsule Take 200 mg by mouth 3 (three) times daily. Take 200 mg by mouth 3 (three) times daily.    ? pantoprazole (PROTONIX) 40 MG tablet Take 1 tablet (40 mg total) by mouth daily. 90 tablet 1  ? valACYclovir (VALTREX) 1000 MG tablet take 1/2 tablet by mouth daily as needed for (cold Sore) 90 tablet 1  ? zinc gluconate 50 MG tablet Take 50 mg by mouth daily.    ? Ascorbic Acid (VITAMIN C PO) Take 1 tablet by mouth daily.    ? carvedilol (COREG) 3.125 MG tablet TAKE 1 TABLET BY MOUTH 2 TIMES DAILY WITH FOOD 180 tablet 0  ? triamterene-hydrochlorothiazide (DYAZIDE) 37.5-25 MG capsule TAKE 1 CAPSULE BY MOUTH EVERY  DAY 90 capsule 0  ? Insulin Glargine-Lixisenatide (SOLIQUA) 100-33 UNT-MCG/ML SOPN Inject 60 Units into the skin daily. (Patient not taking: Reported on 07/04/2021) 54 mL 1  ? Insulin Pen Needle 32G X 6 MM MISC 1 Act by Does not apply route daily. (Patient not taking: Reported on 07/04/2021) 100 each 1  ? ?No facility-administered medications prior to visit.  ? ? ?ROS ?Review of Systems  ?Constitutional:  Negative for chills, diaphoresis, fatigue and fever.  ?HENT: Negative.    ?Eyes: Negative.   ?Respiratory:  Negative for cough, chest tightness, shortness of breath and wheezing.   ?Cardiovascular:  Negative for chest pain, palpitations and leg swelling.  ?Gastrointestinal:  Negative for abdominal pain, diarrhea, nausea and vomiting.  ?Endocrine: Negative.   ?Genitourinary: Negative.  Negative for difficulty urinating.  ?Musculoskeletal:  Negative for arthralgias and myalgias.  ?Skin: Negative.   ?Neurological:  Negative for dizziness, weakness and light-headedness.  ?Hematological:  Negative for adenopathy. Does not bruise/bleed easily.  ?Psychiatric/Behavioral: Negative.    ? ?Objective:  ?BP (!) 142/96 (BP Location: Left Arm, Patient Position: Sitting, Cuff Size: Large)   Pulse 94   Temp 98.2 ?F (36.8 ?C) (Oral)   Resp 16   Ht '5\' 5"'$  (1.651 m)   Wt 202 lb (91.6 kg)   LMP  (LMP Unknown)  SpO2 98%   BMI 33.61 kg/m?  ? ?BP Readings from Last 3 Encounters:  ?07/04/21 (!) 142/96  ?01/17/21 (!) 142/84  ?06/14/20 (!) 160/102  ? ? ?Wt Readings from Last 3 Encounters:  ?07/04/21 202 lb (91.6 kg)  ?01/17/21 210 lb 6.4 oz (95.4 kg)  ?06/14/20 210 lb 9.6 oz (95.5 kg)  ? ? ?Physical Exam ?Vitals reviewed.  ?HENT:  ?   Nose: Nose normal.  ?   Mouth/Throat:  ?   Mouth: Mucous membranes are moist.  ?Eyes:  ?   General: No scleral icterus. ?   Conjunctiva/sclera: Conjunctivae normal.  ?Cardiovascular:  ?   Rate and Rhythm: Normal rate and regular rhythm.  ?   Heart sounds: No murmur heard. ?Pulmonary:  ?   Effort: Pulmonary  effort is normal.  ?   Breath sounds: No stridor. No wheezing, rhonchi or rales.  ?Abdominal:  ?   General: Abdomen is flat.  ?   Palpations: There is no mass.  ?   Tenderness: There is no abdominal tenderness. There is no guarding.  ?   Hernia: No hernia is present.  ?Musculoskeletal:     ?   General: Normal range of motion.  ?   Cervical back: Neck supple.  ?   Right lower leg: No edema.  ?   Left lower leg: No edema.  ?Lymphadenopathy:  ?   Cervical: No cervical adenopathy.  ?Skin: ?   General: Skin is warm and dry.  ?Neurological:  ?   General: No focal deficit present.  ?   Mental Status: She is alert.  ?Psychiatric:     ?   Mood and Affect: Mood normal.     ?   Behavior: Behavior normal.  ? ? ?Lab Results  ?Component Value Date  ? WBC 6.7 01/17/2021  ? HGB 14.9 01/17/2021  ? HCT 44.1 01/17/2021  ? PLT 214.0 01/17/2021  ? GLUCOSE 297 (H) 07/04/2021  ? CHOL 183 07/04/2021  ? TRIG 113.0 07/04/2021  ? HDL 42.00 07/04/2021  ? LDLCALC 118 (H) 07/04/2021  ? ALT 100 (H) 07/04/2021  ? AST 117 (H) 07/04/2021  ? NA 133 (L) 07/04/2021  ? K 3.5 07/04/2021  ? CL 94 (L) 07/04/2021  ? CREATININE 1.20 07/04/2021  ? BUN 14 07/04/2021  ? CO2 27 07/04/2021  ? TSH 1.85 06/14/2020  ? HGBA1C 12.7 (H) 07/04/2021  ? MICROALBUR 0.9 07/04/2021  ? ? ?CT Abdomen Pelvis W Contrast ? ?Addendum Date: 07/17/2020   ?ADDENDUM REPORT: 07/17/2020 07:21 ADDENDUM: There is a 6 mm ground-glass nodule in the right lung as described in the findings section of the report. Initial follow-up with CT at 6-12 months is recommended to confirm persistence. If persistent, repeat CT is recommended every 2 years until 5 years of stability has been established. This recommendation follows the consensus statement: Guidelines for Management of Incidental Pulmonary Nodules Detected on CT Images: From the Fleischner Society 2017; Radiology 2017; 284:228-243. These results will be called to the ordering clinician or representative by the Radiologist Assistant, and  communication documented in the PACS or [employer]. Electronically Signed   By: Dorise Bullion III M.D   On: 07/17/2020 07:21  ? ?Result Date: 07/17/2020 ?CLINICAL DATA:  Acute nonlocalized abdominal pain. Chronic right-sided low pain. Lower abdominal for 3 years. EXAM: CT ABDOMEN AND PELVIS WITH CONTRAST TECHNIQUE: Multidetector CT imaging of the abdomen and pelvis was performed using the standard protocol following bolus administration of intravenous contrast. CONTRAST:  159m ISOVUE-300  IOPAMIDOL (ISOVUE-300) INJECTION 61% COMPARISON:  January 27, 2019 FINDINGS: Lower chest: There is a ground-glass in the right lung base on series 5, image 22 measuring 6 mm no other suspicious nodules. No masses or infiltrates no other abnormalities in the lower chest. Hepatobiliary: Hepatic steatosis is identified. Gallbladder is normal. Portal vein is patent. Pancreas: Unremarkable. No pancreatic ductal dilatation or surrounding inflammatory changes. Spleen: Normal in size without focal abnormality. Adrenals/Urinary Tract: The known adrenal adenoma is stable. Adrenal glands are all otherwise normal few tiny low-attenuation lesions the kidneys too small to characterize likely cysts. No suspicious masses, hydronephrosis, or perinephric stranding. No ureterectasis or ureteral stones. The bladder is normal. Stomach/Bowel: The stomach is unremarkable. The wall the second portion of the duodenum is mildly adjacent fat stranding. This is likely due to lack distention. The distal esophagus stomach small bowel, colon and visualized appendix are otherwise normal Vascular/Lymphatic: No significant vascular findings are present. No enlarged abdominal or pelvic lymph nodes. Reproductive: Status post hysterectomy. No adnexal masses. Other: No abdominal wall hernia or abnormality. No abdominopelvic ascites. Musculoskeletal: No acute or significant osseous findings. IMPRESSION: 1. No acute abnormalities are identified. No cause for the  patient's abdominal pain noted. 2. There is a 6 mm ground-glass nodule in the right lung as above. 3. Hepatic steatosis. 4. Known left adrenal adenoma. 5. No other acute abnormalities. Electronically Signed: By

## 2021-08-27 ENCOUNTER — Other Ambulatory Visit: Payer: Self-pay | Admitting: Internal Medicine

## 2021-08-27 DIAGNOSIS — E785 Hyperlipidemia, unspecified: Secondary | ICD-10-CM

## 2021-08-31 ENCOUNTER — Other Ambulatory Visit: Payer: Self-pay | Admitting: Internal Medicine

## 2021-08-31 DIAGNOSIS — K2100 Gastro-esophageal reflux disease with esophagitis, without bleeding: Secondary | ICD-10-CM

## 2021-09-07 LAB — HM DIABETES EYE EXAM

## 2021-09-12 ENCOUNTER — Other Ambulatory Visit (HOSPITAL_BASED_OUTPATIENT_CLINIC_OR_DEPARTMENT_OTHER): Payer: 59

## 2021-09-13 ENCOUNTER — Telehealth: Payer: Self-pay | Admitting: Internal Medicine

## 2021-09-13 ENCOUNTER — Ambulatory Visit (INDEPENDENT_AMBULATORY_CARE_PROVIDER_SITE_OTHER): Payer: Commercial Managed Care - HMO | Admitting: Internal Medicine

## 2021-09-13 ENCOUNTER — Encounter: Payer: Self-pay | Admitting: Internal Medicine

## 2021-09-13 VITALS — BP 140/88 | HR 89 | Temp 98.3°F | Ht 65.0 in | Wt 197.0 lb

## 2021-09-13 DIAGNOSIS — L2084 Intrinsic (allergic) eczema: Secondary | ICD-10-CM

## 2021-09-13 DIAGNOSIS — R10817 Generalized abdominal tenderness: Secondary | ICD-10-CM

## 2021-09-13 DIAGNOSIS — R7989 Other specified abnormal findings of blood chemistry: Secondary | ICD-10-CM

## 2021-09-13 DIAGNOSIS — R918 Other nonspecific abnormal finding of lung field: Secondary | ICD-10-CM

## 2021-09-13 DIAGNOSIS — Z794 Long term (current) use of insulin: Secondary | ICD-10-CM

## 2021-09-13 DIAGNOSIS — N1831 Chronic kidney disease, stage 3a: Secondary | ICD-10-CM

## 2021-09-13 DIAGNOSIS — I10 Essential (primary) hypertension: Secondary | ICD-10-CM

## 2021-09-13 DIAGNOSIS — J301 Allergic rhinitis due to pollen: Secondary | ICD-10-CM | POA: Diagnosis not present

## 2021-09-13 DIAGNOSIS — R911 Solitary pulmonary nodule: Secondary | ICD-10-CM

## 2021-09-13 DIAGNOSIS — R778 Other specified abnormalities of plasma proteins: Secondary | ICD-10-CM

## 2021-09-13 DIAGNOSIS — E119 Type 2 diabetes mellitus without complications: Secondary | ICD-10-CM | POA: Diagnosis not present

## 2021-09-13 LAB — CBC WITH DIFFERENTIAL/PLATELET
Basophils Absolute: 0 10*3/uL (ref 0.0–0.1)
Basophils Relative: 0.3 % (ref 0.0–3.0)
Eosinophils Absolute: 0.1 10*3/uL (ref 0.0–0.7)
Eosinophils Relative: 0.6 % (ref 0.0–5.0)
HCT: 48.5 % — ABNORMAL HIGH (ref 36.0–46.0)
Hemoglobin: 16.3 g/dL — ABNORMAL HIGH (ref 12.0–15.0)
Lymphocytes Relative: 31.8 % (ref 12.0–46.0)
Lymphs Abs: 2.9 10*3/uL (ref 0.7–4.0)
MCHC: 33.7 g/dL (ref 30.0–36.0)
MCV: 92.6 fl (ref 78.0–100.0)
Monocytes Absolute: 0.5 10*3/uL (ref 0.1–1.0)
Monocytes Relative: 4.9 % (ref 3.0–12.0)
Neutro Abs: 5.8 10*3/uL (ref 1.4–7.7)
Neutrophils Relative %: 62.4 % (ref 43.0–77.0)
Platelets: 237 10*3/uL (ref 150.0–400.0)
RBC: 5.24 Mil/uL — ABNORMAL HIGH (ref 3.87–5.11)
RDW: 14 % (ref 11.5–15.5)
WBC: 9.3 10*3/uL (ref 4.0–10.5)

## 2021-09-13 LAB — AMYLASE: Amylase: 49 U/L (ref 27–131)

## 2021-09-13 LAB — BASIC METABOLIC PANEL
BUN: 21 mg/dL (ref 6–23)
CO2: 30 mEq/L (ref 19–32)
Calcium: 11.4 mg/dL — ABNORMAL HIGH (ref 8.4–10.5)
Chloride: 96 mEq/L (ref 96–112)
Creatinine, Ser: 0.95 mg/dL (ref 0.40–1.20)
GFR: 64.25 mL/min (ref >60.00)
Glucose, Bld: 225 mg/dL — ABNORMAL HIGH (ref 70–99)
Potassium: 3.7 mEq/L (ref 3.5–5.1)
Sodium: 136 mEq/L (ref 135–145)

## 2021-09-13 LAB — HEPATIC FUNCTION PANEL
ALT: 33 U/L (ref 0–35)
AST: 29 U/L (ref 0–37)
Albumin: 4.8 g/dL (ref 3.5–5.2)
Alkaline Phosphatase: 120 U/L — ABNORMAL HIGH (ref 39–117)
Bilirubin, Direct: 0.1 mg/dL (ref 0.0–0.3)
Total Bilirubin: 0.3 mg/dL (ref 0.2–1.2)
Total Protein: 8.8 g/dL — ABNORMAL HIGH (ref 6.0–8.3)

## 2021-09-13 LAB — LIPASE: Lipase: 52 U/L (ref 11.0–59.0)

## 2021-09-13 LAB — PROTIME-INR
INR: 1 ratio (ref 0.8–1.0)
Prothrombin Time: 10.8 s (ref 9.6–13.1)

## 2021-09-13 LAB — HEMOGLOBIN A1C: Hgb A1c MFr Bld: 13.3 % — ABNORMAL HIGH (ref 4.6–6.5)

## 2021-09-13 MED ORDER — FLUTICASONE PROPIONATE 50 MCG/ACT NA SUSP: 2.0000 | Freq: Every day | NASAL | 1 refills | Status: DC

## 2021-09-13 NOTE — Progress Notes (Signed)
Subjective:  [name], female    DOB: 30-Jun-1960  Age: 61 y.o. MRN: [id number]  CC: Abdominal Pain, Hypertension, and Diabetes   HPI Timika Muench presents for f/up-  She has not had the CT chest of abdomen and chest done yet because of an insurance issue.  She has better insurance and says she is willing to do the CAT scans.  She complains of intermittent abdominal pain that she describes as a diffuse stabbing sensation with nausea, vomiting, and weight loss.  She denies chest pain, shortness of breath, fever, chills, dysuria, or hematuria.   Outpatient Medications Prior to Visit  Medication Sig Dispense Refill   acetaminophen (TYLENOL) 500 MG tablet Take 2 tablets (1,000 mg total) by mouth every 6 (six) hours. 60 tablet 1   amitriptyline (ELAVIL) 75 MG tablet Take 75 mg by mouth daily. Take 1 by mouth daily     atorvastatin (LIPITOR) 40 MG tablet TAKE 1 TABLET BY MOUTH EVERY DAY 90 tablet 1   carvedilol (COREG) 3.125 MG tablet Take 1 tablet (3.125 mg total) by mouth 2 (two) times daily with a meal. 180 tablet 1   Cholecalciferol 50 MCG (2000 UT) TABS Take 1 tablet (2,000 Units total) by mouth daily. 90 tablet 3   fexofenadine (ALLEGRA) 180 MG tablet Take 180 mg by mouth daily.     gabapentin (NEURONTIN) 100 MG capsule Take 200 mg by mouth 3 (three) times daily. Take 200 mg by mouth 3 (three) times daily.     Insulin Glargine-Lixisenatide (SOLIQUA) 100-33 UNT-MCG/ML SOPN Inject 60 Units into the skin daily. 54 mL 1   Insulin Pen Needle 32G X 6 MM MISC 1 Act by Does not apply route daily. 100 each 1   pantoprazole (PROTONIX) 40 MG tablet Take 1 tablet by mouth daily. 90 tablet 1   valACYclovir (VALTREX) 1000 MG tablet take 1/2 tablet by mouth daily as needed for (cold Sore) 90 tablet 1   zinc gluconate 50 MG tablet Take 50 mg by mouth daily.     Continuous Blood Gluc Receiver (FREESTYLE LIBRE 2 READER) DEVI 1 Act by Does not apply route daily. 2 each 5   Continuous  Blood Gluc Sensor (FREESTYLE LIBRE 2 SENSOR) MISC 1 Act by Does not apply route daily. 2 each 5   fluticasone (FLONASE) 50 MCG/ACT nasal spray SPRAY 2 SPRAYS INTO EACH NOSTRIL EVERY DAY (Patient taking differently: Place 2 sprays into both nostrils daily as needed for allergies.) 48 mL 1   triamterene-hydrochlorothiazide (DYAZIDE) 37.5-25 MG capsule Take 1 each (1 capsule total) by mouth daily. 90 capsule 0   No facility-administered medications prior to visit.    ROS Review of Systems  Constitutional:  Positive for appetite change and unexpected weight change. Negative for chills, diaphoresis, fatigue and fever.  HENT: Negative.    Eyes: Negative.   Respiratory:  Negative for cough, chest tightness, shortness of breath and wheezing.   Cardiovascular:  Negative for chest pain, palpitations and leg swelling.  Gastrointestinal:  Positive for abdominal pain, diarrhea and nausea. Negative for constipation and vomiting.  Endocrine: Negative.   Genitourinary: Negative.  Negative for difficulty urinating, dysuria, hematuria and urgency.  Musculoskeletal: Negative.  Negative for back pain and myalgias.  Skin: Negative.   Neurological:  Negative for dizziness, weakness and headaches.  Hematological:  Negative for adenopathy. Does not bruise/bleed easily.  Psychiatric/Behavioral: Negative.      Objective:  BP 140/88 (BP Location: Left Arm, Patient Position: Sitting, Cuff Size:  Large)   Pulse 89   Temp 98.3 F (36.8 C) (Oral)   Ht 5' 5"  (1.651 m)   Wt 197 lb (89.4 kg)   LMP  (LMP Unknown)   SpO2 97%   BMI 32.78 kg/m   BP Readings from Last 3 Encounters:  09/13/21 140/88  07/04/21 (!) 142/96  01/17/21 (!) 142/84    Wt Readings from Last 3 Encounters:  09/13/21 197 lb (89.4 kg)  07/04/21 202 lb (91.6 kg)  01/17/21 210 lb 6.4 oz (95.4 kg)    Physical Exam Vitals reviewed.  Constitutional:      General: She is not in acute distress.    Appearance: She is not ill-appearing,  toxic-appearing or diaphoretic.  HENT:     Nose: Nose normal.     Mouth/Throat:     Mouth: Mucous membranes are moist.  Eyes:     General: No scleral icterus.    Conjunctiva/sclera: Conjunctivae normal.  Cardiovascular:     Rate and Rhythm: Normal rate and regular rhythm.     Heart sounds: No murmur heard. Pulmonary:     Effort: Pulmonary effort is normal.     Breath sounds: No stridor. No wheezing, rhonchi or rales.  Abdominal:     General: Abdomen is flat. Bowel sounds are decreased. There is no distension.     Palpations: Abdomen is soft. There is no hepatomegaly, splenomegaly or mass.     Tenderness: There is abdominal tenderness in the right lower quadrant. There is no guarding or rebound.  Musculoskeletal:        General: Normal range of motion.     Cervical back: Neck supple.     Right lower leg: No edema.     Left lower leg: No edema.  Lymphadenopathy:     Cervical: No cervical adenopathy.  Skin:    General: Skin is warm and dry.     Findings: No rash.  Neurological:     General: No focal deficit present.     Mental Status: She is alert.  Psychiatric:        Mood and Affect: Mood normal.        Behavior: Behavior normal.     Lab Results  Component Value Date   WBC 9.3 09/13/2021   HGB 16.3 (H) 09/13/2021   HCT 48.5 (H) 09/13/2021   PLT 237.0 09/13/2021   GLUCOSE 225 (H) 09/13/2021   CHOL 183 07/04/2021   TRIG 113.0 07/04/2021   HDL 42.00 07/04/2021   LDLCALC 118 (H) 07/04/2021   ALT 33 09/13/2021   AST 29 09/13/2021   NA 136 09/13/2021   K 3.7 09/13/2021   CL 96 09/13/2021   CREATININE 0.95 09/13/2021   BUN 21 09/13/2021   CO2 30 09/13/2021   TSH 1.85 06/14/2020   INR 1.0 09/13/2021   HGBA1C 13.3 (H) 09/13/2021   MICROALBUR 0.9 07/04/2021    CT Abdomen Pelvis W Contrast  Addendum Date: 07/17/2020   ADDENDUM REPORT: 07/17/2020 07:21 ADDENDUM: There is a 6 mm ground-glass nodule in the right lung as described in the findings section of the report.  Initial follow-up with CT at 6-12 months is recommended to confirm persistence. If persistent, repeat CT is recommended every 2 years until 5 years of stability has been established. This recommendation follows the consensus statement: Guidelines for Management of Incidental Pulmonary Nodules Detected on CT Images: From the Fleischner Society 2017; Radiology 2017; 284:228-243. These results will be called to the ordering clinician or representative by the  Psychologist, clinical, and communication documented in the PACS or [employer]. Electronically Signed   By: Dorise Bullion III M.D   On: 07/17/2020 07:21   Result Date: 07/17/2020 CLINICAL DATA:  Acute nonlocalized abdominal pain. Chronic right-sided low pain. Lower abdominal for 3 years. EXAM: CT ABDOMEN AND PELVIS WITH CONTRAST TECHNIQUE: Multidetector CT imaging of the abdomen and pelvis was performed using the standard protocol following bolus administration of intravenous contrast. CONTRAST:  111m ISOVUE-300 IOPAMIDOL (ISOVUE-300) INJECTION 61% COMPARISON:  January 27, 2019 FINDINGS: Lower chest: There is a ground-glass in the right lung base on series 5, image 22 measuring 6 mm no other suspicious nodules. No masses or infiltrates no other abnormalities in the lower chest. Hepatobiliary: Hepatic steatosis is identified. Gallbladder is normal. Portal vein is patent. Pancreas: Unremarkable. No pancreatic ductal dilatation or surrounding inflammatory changes. Spleen: Normal in size without focal abnormality. Adrenals/Urinary Tract: The known adrenal adenoma is stable. Adrenal glands are all otherwise normal few tiny low-attenuation lesions the kidneys too small to characterize likely cysts. No suspicious masses, hydronephrosis, or perinephric stranding. No ureterectasis or ureteral stones. The bladder is normal. Stomach/Bowel: The stomach is unremarkable. The wall the second portion of the duodenum is mildly adjacent fat stranding. This is likely due  to lack distention. The distal esophagus stomach small bowel, colon and visualized appendix are otherwise normal Vascular/Lymphatic: No significant vascular findings are present. No enlarged abdominal or pelvic lymph nodes. Reproductive: Status post hysterectomy. No adnexal masses. Other: No abdominal wall hernia or abnormality. No abdominopelvic ascites. Musculoskeletal: No acute or significant osseous findings. IMPRESSION: 1. No acute abnormalities are identified. No cause for the patient's abdominal pain noted. 2. There is a 6 mm ground-glass nodule in the right lung as above. 3. Hepatic steatosis. 4. Known left adrenal adenoma. 5. No other acute abnormalities. Electronically Signed: By: DDorise BullionIII M.D On: 07/16/2020 21:28    Assessment & Plan:   AShenellewas seen today for abdominal pain, hypertension and diabetes.  Diagnoses and all orders for this visit:  Generalized abdominal tenderness without rebound tenderness- Her LFTs have normalized but her alk phos is still elevated.  She is symptomatic so I recommended a CT scan of the abdomen and pelvis with contrast. -     Urinalysis, Routine w reflex microscopic; Future -     Amylase; Future -     Lipase; Future -     Lipase -     Amylase -     Urinalysis, Routine w reflex microscopic -     CT Abdomen Pelvis W Contrast; Future  Seasonal allergic rhinitis due to pollen -     Discontinue: fluticasone (FLONASE) 50 MCG/ACT nasal spray; Place 2 sprays into both nostrils daily. SPRAY 2 SPRAYS INTO EACH NOSTRIL EVERY DAY Strength: 50 MCG/ACT -     fluticasone (FLONASE) 50 MCG/ACT nasal spray; Place 2 sprays into both nostrils daily.  Essential hypertension- Her blood pressure is adequately well controlled but she has developed hypercalcemia.  Will discontinue the thiazide diuretic and will start a loop diuretic. -     Basic metabolic panel; Future -     CBC with Differential/Platelet; Future -     Hepatic function panel; Future -      Urinalysis, Routine w reflex microscopic; Future -     Urinalysis, Routine w reflex microscopic -     Hepatic function panel -     CBC with Differential/Platelet -     Basic metabolic panel  Elevated  LFTs- Testing for viral hepatitis is negative. -     Protime-INR; Future -     Hepatic function panel; Future -     Hepatitis B surface antigen; Future -     Hepatitis C antibody; Future -     Hepatitis B surface antibody,quantitative; Future -     Hepatitis A antibody, total; Future -     Hepatitis B core antibody, total; Future -     Amylase; Future -     Lipase; Future -     Lipase -     Amylase -     Hepatitis B core antibody, total -     Hepatitis A antibody, total -     Hepatitis B surface antibody,quantitative -     Hepatitis C antibody -     Hepatitis B surface antigen -     Hepatic function panel -     Protime-INR -     CT Abdomen Pelvis W Contrast; Future  Insulin-requiring or dependent type II diabetes mellitus ([place])- Her A1c is up to 13.3%.  I have asked her to be compliant with Soliqua and will add bolus insulin with meals.  She does not tolerate metformin. -     Hemoglobin A1c; Future -     Hemoglobin A1c -     Continuous Blood Gluc Receiver (FREESTYLE LIBRE 2 READER) DEVI; 1 Act by Does not apply route daily. -     Continuous Blood Gluc Sensor (FREESTYLE LIBRE 2 SENSOR) MISC; 1 Act by Does not apply route daily. -     insulin aspart (NOVOLOG FLEXPEN) 100 UNIT/ML FlexPen; Inject 10 Units into the skin 3 (three) times daily with meals.  Nodule of right lung- She has hypercalcemia.  Will screen for malignancy. -     CT Chest W Contrast; Future  Stage 3a chronic kidney disease ([place])- Her renal function has improved.  Abnormal findings on diagnostic imaging of lung -     CT Chest W Contrast; Future  Elevated total protein -     CT Chest W Contrast; Future -     CT Abdomen Pelvis W Contrast; Future   I have discontinued Gaynor Geigle's fluticasone and  triamterene-hydrochlorothiazide. I have also changed her fluticasone. Additionally, I am having her start on NovoLOG FlexPen. Lastly, I am having her maintain her fexofenadine, zinc gluconate, acetaminophen, Cholecalciferol, amitriptyline, gabapentin, valACYclovir, carvedilol, Soliqua, Insulin Pen Needle, atorvastatin, pantoprazole, FreeStyle Libre 2 Reader, and [employer] 2 Sensor.  Meds ordered this encounter  Medications   DISCONTD: fluticasone (FLONASE) 50 MCG/ACT nasal spray    Sig: Place 2 sprays into both nostrils daily. SPRAY 2 SPRAYS INTO EACH NOSTRIL EVERY DAY Strength: 50 MCG/ACT    Dispense:  48 mL    Refill:  1   fluticasone (FLONASE) 50 MCG/ACT nasal spray    Sig: Place 2 sprays into both nostrils daily.    Dispense:  48 mL    Refill:  1   Continuous Blood Gluc Receiver (FREESTYLE LIBRE 2 READER) DEVI    Sig: 1 Act by Does not apply route daily.    Dispense:  2 each    Refill:  5   Continuous Blood Gluc Sensor (FREESTYLE LIBRE 2 SENSOR) MISC    Sig: 1 Act by Does not apply route daily.    Dispense:  2 each    Refill:  5   insulin aspart (NOVOLOG FLEXPEN) 100 UNIT/ML FlexPen    Sig: Inject 10 Units into the skin 3 (  three) times daily with meals.    Dispense:  27 mL    Refill:  1   I spent 50 minutes in preparing to see the patient by review of recent labs and imaging, obtaining and reviewing separately obtained history, communicating with the patient, ordering medications and xrays, and documenting clinical information in the EHR including the differential Dx, treatment, and any further evaluation and management of multiple complex medical issues.      Follow-up: Return in about 3 weeks (around 10/04/2021).  [name], MD

## 2021-09-13 NOTE — Patient Instructions (Signed)

## 2021-09-14 ENCOUNTER — Other Ambulatory Visit: Payer: Self-pay | Admitting: Internal Medicine

## 2021-09-14 ENCOUNTER — Telehealth: Payer: Self-pay | Admitting: Internal Medicine

## 2021-09-14 DIAGNOSIS — I10 Essential (primary) hypertension: Secondary | ICD-10-CM

## 2021-09-14 DIAGNOSIS — L2084 Intrinsic (allergic) eczema: Secondary | ICD-10-CM | POA: Insufficient documentation

## 2021-09-14 DIAGNOSIS — R778 Other specified abnormalities of plasma proteins: Secondary | ICD-10-CM | POA: Insufficient documentation

## 2021-09-14 LAB — HEPATITIS B SURFACE ANTIGEN: Hepatitis B Surface Ag: NONREACTIVE

## 2021-09-14 LAB — URINALYSIS, ROUTINE W REFLEX MICROSCOPIC
Bilirubin Urine: NEGATIVE
Hgb urine dipstick: NEGATIVE
Ketones, ur: NEGATIVE
Leukocytes,Ua: NEGATIVE
Nitrite: NEGATIVE
Specific Gravity, Urine: 1.015 (ref 1.000–1.030)
Total Protein, Urine: NEGATIVE
Urine Glucose: 250 — AB
Urobilinogen, UA: 0.2 (ref 0.0–1.0)
pH: 6.5 (ref 5.0–8.0)

## 2021-09-14 LAB — HEPATITIS A ANTIBODY, TOTAL: Hepatitis A AB,Total: REACTIVE — AB

## 2021-09-14 LAB — HEPATITIS C ANTIBODY: Hepatitis C Ab: NONREACTIVE

## 2021-09-14 LAB — HEPATITIS B SURFACE ANTIBODY, QUANTITATIVE: Hep B S AB Quant (Post): <5 m[IU]/mL — ABNORMAL LOW (ref >=10)

## 2021-09-14 LAB — HEPATITIS B CORE ANTIBODY, TOTAL: Hep B Core Total Ab: NONREACTIVE

## 2021-09-14 MED ORDER — FREESTYLE LIBRE 2 SENSOR MISC: 1.0000 | Freq: Every day | 5 refills | Status: AC

## 2021-09-14 MED ORDER — TRIAMCINOLONE ACETONIDE 0.5 % EX CREA: 1.0000 | TOPICAL_CREAM | Freq: Three times a day (TID) | CUTANEOUS | 1 refills | Status: DC

## 2021-09-14 MED ORDER — FREESTYLE LIBRE 2 READER DEVI: 1.0000 | Freq: Every day | 5 refills | Status: AC

## 2021-09-14 MED ORDER — NOVOLOG FLEXPEN 100 UNIT/ML ~~LOC~~ SOPN: 10.0000 [IU] | PEN_INJECTOR | Freq: Three times a day (TID) | SUBCUTANEOUS | 1 refills | Status: AC

## 2021-09-14 NOTE — Telephone Encounter (Signed)
Pt called and stated she saw Dr. Ronnald Ramp 09/13/21 and he was supposed to prescribe her something for her skin irritation. Pt stated the pharmacy advised her there was no rx sent over for skin irritation. Pt would like an rx for her skin. Pt stated she does not know the name of the rx ,she stated Dr. Ronnald Ramp told her at the end of visit that all of her rx's have been sent over to the pharmacy so she assumed the skin rx was sent as well.   Pharmacy:  [facility] - [place], [place] - [address] Phone:  [phone]  Fax:  [phone]       Please advise.

## 2021-09-15 MED ORDER — TORSEMIDE 20 MG PO TABS: 20.0000 mg | ORAL_TABLET | Freq: Every day | ORAL | 0 refills | Status: DC

## 2021-09-19 ENCOUNTER — Telehealth: Payer: Self-pay

## 2021-09-19 NOTE — Telephone Encounter (Signed)
Pt is calling in after reading the side effects of torsemide (DEMADEX) 20 MG tablet and is requesting to be put on another BP medication with less side effects hair loss was the one she read off.  Please advise

## 2021-09-21 ENCOUNTER — Other Ambulatory Visit: Payer: Self-pay | Admitting: Internal Medicine

## 2021-09-21 DIAGNOSIS — I10 Essential (primary) hypertension: Secondary | ICD-10-CM

## 2021-09-21 MED ORDER — AMLODIPINE BESYLATE 5 MG PO TABS: 5.0000 mg | ORAL_TABLET | Freq: Every day | ORAL | 0 refills | Status: DC

## 2021-09-22 ENCOUNTER — Other Ambulatory Visit: Payer: Self-pay | Admitting: Internal Medicine

## 2021-09-22 DIAGNOSIS — I10 Essential (primary) hypertension: Secondary | ICD-10-CM

## 2021-09-29 ENCOUNTER — Telehealth: Payer: Self-pay

## 2021-09-29 NOTE — Telephone Encounter (Signed)
Pt is calling in for her results from 09/13/21.   Pt CB is [phone]

## 2021-10-04 NOTE — Telephone Encounter (Signed)
Spoke with the patient about her lab results. She verbalized understanding them. She has not been using the injectable to control her blood sugars.

## 2021-10-12 ENCOUNTER — Ambulatory Visit
Admission: RE | Admit: 2021-10-12 | Discharge: 2021-10-12 | Disposition: A | Discharge status: Home / Self Care | Payer: Commercial Managed Care - HMO | Source: Ambulatory Visit | Attending: Internal Medicine | Admitting: Internal Medicine

## 2021-10-12 DIAGNOSIS — R911 Solitary pulmonary nodule: Secondary | ICD-10-CM

## 2021-10-12 DIAGNOSIS — R10817 Generalized abdominal tenderness: Secondary | ICD-10-CM

## 2021-10-12 DIAGNOSIS — R918 Other nonspecific abnormal finding of lung field: Secondary | ICD-10-CM

## 2021-10-12 DIAGNOSIS — R778 Other specified abnormalities of plasma proteins: Secondary | ICD-10-CM

## 2021-10-12 DIAGNOSIS — R7989 Other specified abnormal findings of blood chemistry: Secondary | ICD-10-CM

## 2021-10-12 MED ORDER — IOPAMIDOL (ISOVUE-300) INJECTION 61%
100.0000 mL | Freq: Once | INTRAVENOUS | Status: AC | PRN
  Administered 2021-10-12: 100 mL via INTRAVENOUS

## 2021-10-14 ENCOUNTER — Telehealth: Payer: Self-pay | Admitting: Internal Medicine

## 2021-10-14 NOTE — Telephone Encounter (Signed)
Patient would like someone to call her with the results of the CT scan that she had on Wednesday of this week.

## 2021-10-17 ENCOUNTER — Other Ambulatory Visit: Payer: Self-pay | Admitting: Internal Medicine

## 2021-10-17 DIAGNOSIS — L2084 Intrinsic (allergic) eczema: Secondary | ICD-10-CM

## 2021-10-17 NOTE — Telephone Encounter (Signed)
Called pt, LVM.   

## 2021-10-17 NOTE — Telephone Encounter (Signed)
Advised Kathryn Parks to check her mychart message per Dr. Ronnald Ramp. Kathryn Parks states she does not have access to mychart and to just have someone call her and discuss the results to her CT Scan.  CB: [phone]

## 2021-10-19 NOTE — Telephone Encounter (Signed)
Per MyChart messgae:  "Kathryn Parks -   The CT scan of your chest shows a tiny nodule that is stable.  We can recheck this in 1 year.  The CT scan of your abdomen was normal.   [name], MD"  Pt has been informed and expressed understanding. She is inquiring about what could cause the abdominal pain that she is having? Please advise.

## 2021-11-14 ENCOUNTER — Ambulatory Visit (INDEPENDENT_AMBULATORY_CARE_PROVIDER_SITE_OTHER): Payer: Commercial Managed Care - HMO | Admitting: Internal Medicine

## 2021-11-14 ENCOUNTER — Encounter: Payer: Self-pay | Admitting: Internal Medicine

## 2021-11-14 VITALS — BP 146/98 | HR 90 | Temp 98.2°F | Ht 65.0 in | Wt 201.0 lb

## 2021-11-14 DIAGNOSIS — R1310 Dysphagia, unspecified: Secondary | ICD-10-CM

## 2021-11-14 DIAGNOSIS — T50905A Adverse effect of unspecified drugs, medicaments and biological substances, initial encounter: Secondary | ICD-10-CM | POA: Insufficient documentation

## 2021-11-14 DIAGNOSIS — R3121 Asymptomatic microscopic hematuria: Secondary | ICD-10-CM | POA: Insufficient documentation

## 2021-11-14 DIAGNOSIS — R10A2 Flank pain, left side: Secondary | ICD-10-CM

## 2021-11-14 DIAGNOSIS — E119 Type 2 diabetes mellitus without complications: Secondary | ICD-10-CM | POA: Diagnosis not present

## 2021-11-14 DIAGNOSIS — Z23 Encounter for immunization: Secondary | ICD-10-CM | POA: Diagnosis not present

## 2021-11-14 DIAGNOSIS — N1831 Chronic kidney disease, stage 3a: Secondary | ICD-10-CM

## 2021-11-14 DIAGNOSIS — E785 Hyperlipidemia, unspecified: Secondary | ICD-10-CM | POA: Diagnosis not present

## 2021-11-14 DIAGNOSIS — R109 Unspecified abdominal pain: Secondary | ICD-10-CM | POA: Insufficient documentation

## 2021-11-14 DIAGNOSIS — I10 Essential (primary) hypertension: Secondary | ICD-10-CM

## 2021-11-14 DIAGNOSIS — R1012 Left upper quadrant pain: Secondary | ICD-10-CM

## 2021-11-14 DIAGNOSIS — Z794 Long term (current) use of insulin: Secondary | ICD-10-CM

## 2021-11-14 LAB — URINALYSIS, ROUTINE W REFLEX MICROSCOPIC
Bilirubin Urine: NEGATIVE
Ketones, ur: NEGATIVE
Leukocytes,Ua: NEGATIVE
Nitrite: NEGATIVE
Specific Gravity, Urine: <=1.005 — AB (ref 1.000–1.030)
Total Protein, Urine: NEGATIVE
Urine Glucose: >=1000 — AB
Urobilinogen, UA: 0.2 (ref 0.0–1.0)
pH: 6.5 (ref 5.0–8.0)

## 2021-11-14 LAB — LIPID PANEL
Cholesterol: 196 mg/dL (ref 0–200)
HDL: 68.7 mg/dL (ref >39.00)
LDL Cholesterol: 108 mg/dL — ABNORMAL HIGH (ref 0–99)
NonHDL: 127.01
Total CHOL/HDL Ratio: 3
Triglycerides: 96 mg/dL (ref 0.0–149.0)
VLDL: 19.2 mg/dL (ref 0.0–40.0)

## 2021-11-14 LAB — CBC WITH DIFFERENTIAL/PLATELET
Basophils Absolute: 0 10*3/uL (ref 0.0–0.1)
Basophils Relative: 0.1 % (ref 0.0–3.0)
Eosinophils Absolute: 0 10*3/uL (ref 0.0–0.7)
Eosinophils Relative: 0.4 % (ref 0.0–5.0)
HCT: 46 % (ref 36.0–46.0)
Hemoglobin: 15.5 g/dL — ABNORMAL HIGH (ref 12.0–15.0)
Lymphocytes Relative: 21.3 % (ref 12.0–46.0)
Lymphs Abs: 2 10*3/uL (ref 0.7–4.0)
MCHC: 33.7 g/dL (ref 30.0–36.0)
MCV: 92.9 fl (ref 78.0–100.0)
Monocytes Absolute: 0.4 10*3/uL (ref 0.1–1.0)
Monocytes Relative: 3.9 % (ref 3.0–12.0)
Neutro Abs: 6.9 10*3/uL (ref 1.4–7.7)
Neutrophils Relative %: 74.3 % (ref 43.0–77.0)
Platelets: 213 10*3/uL (ref 150.0–400.0)
RBC: 4.95 Mil/uL (ref 3.87–5.11)
RDW: 15.4 % (ref 11.5–15.5)
WBC: 9.3 10*3/uL (ref 4.0–10.5)

## 2021-11-14 LAB — HEPATIC FUNCTION PANEL
ALT: 31 U/L (ref 0–35)
AST: 24 U/L (ref 0–37)
Albumin: 4.4 g/dL (ref 3.5–5.2)
Alkaline Phosphatase: 139 U/L — ABNORMAL HIGH (ref 39–117)
Bilirubin, Direct: 0 mg/dL (ref 0.0–0.3)
Total Bilirubin: 0.4 mg/dL (ref 0.2–1.2)
Total Protein: 8.4 g/dL — ABNORMAL HIGH (ref 6.0–8.3)

## 2021-11-14 LAB — BASIC METABOLIC PANEL
BUN: 12 mg/dL (ref 6–23)
CO2: 28 mEq/L (ref 19–32)
Calcium: 10.3 mg/dL (ref 8.4–10.5)
Chloride: 98 mEq/L (ref 96–112)
Creatinine, Ser: 0.91 mg/dL (ref 0.40–1.20)
GFR: 67.57 mL/min (ref >60.00)
Glucose, Bld: 296 mg/dL — ABNORMAL HIGH (ref 70–99)
Potassium: 3.9 mEq/L (ref 3.5–5.1)
Sodium: 136 mEq/L (ref 135–145)

## 2021-11-14 LAB — TSH: TSH: 0.97 u[IU]/mL (ref 0.35–5.50)

## 2021-11-14 LAB — HEMOGLOBIN A1C: Hgb A1c MFr Bld: 11.7 % — ABNORMAL HIGH (ref 4.6–6.5)

## 2021-11-14 NOTE — Progress Notes (Unsigned)
Subjective:  [name], female    DOB: 01-03-1961  Age: 61 y.o. MRN: [id number]  CC: No chief complaint on file.   HPI Kathryn Parks presents for ***  Outpatient Medications Prior to Visit  Medication Sig Dispense Refill   acetaminophen (TYLENOL) 500 MG tablet Take 2 tablets (1,000 mg total) by mouth every 6 (six) hours. 60 tablet 1   amitriptyline (ELAVIL) 75 MG tablet Take 75 mg by mouth daily. Take 1 by mouth daily     amLODipine (NORVASC) 5 MG tablet Take 1 tablet (5 mg total) by mouth daily. 90 tablet 0   atorvastatin (LIPITOR) 40 MG tablet TAKE 1 TABLET BY MOUTH EVERY DAY 90 tablet 1   carvedilol (COREG) 3.125 MG tablet Take 1 tablet by mouth 2 times daily with a meal. 180 tablet 1   Cholecalciferol 50 MCG (2000 UT) TABS Take 1 tablet (2,000 Units total) by mouth daily. 90 tablet 3   Continuous Blood Gluc Receiver (FREESTYLE LIBRE 2 READER) DEVI 1 Act by Does not apply route daily. 2 each 5   Continuous Blood Gluc Sensor (FREESTYLE LIBRE 2 SENSOR) MISC 1 Act by Does not apply route daily. 2 each 5   fexofenadine (ALLEGRA) 180 MG tablet Take 180 mg by mouth daily.     fluticasone (FLONASE) 50 MCG/ACT nasal spray Place 2 sprays into both nostrils daily. 48 mL 1   gabapentin (NEURONTIN) 100 MG capsule Take 200 mg by mouth 3 (three) times daily. Take 200 mg by mouth 3 (three) times daily.     insulin aspart (NOVOLOG FLEXPEN) 100 UNIT/ML FlexPen Inject 10 Units into the skin 3 (three) times daily with meals. 27 mL 1   Insulin Glargine-Lixisenatide (SOLIQUA) 100-33 UNT-MCG/ML SOPN Inject 60 Units into the skin daily. 54 mL 1   Insulin Pen Needle 32G X 6 MM MISC 1 Act by Does not apply route daily. 100 each 1   pantoprazole (PROTONIX) 40 MG tablet Take 1 tablet by mouth daily. 90 tablet 1   torsemide (DEMADEX) 20 MG tablet Take 1 tablet (20 mg total) by mouth daily. 90 tablet 0   triamcinolone cream (KENALOG) 0.5 % apply 1 application to affected area 3 times daily 60 g  1   valACYclovir (VALTREX) 1000 MG tablet take 1/2 tablet by mouth daily as needed for (cold Sore) 90 tablet 1   zinc gluconate 50 MG tablet Take 50 mg by mouth daily.     No facility-administered medications prior to visit.    ROS Review of Systems  Objective:  LMP  (LMP Unknown)   BP Readings from Last 3 Encounters:  09/13/21 140/88  07/04/21 (!) 142/96  01/17/21 (!) 142/84    Wt Readings from Last 3 Encounters:  09/13/21 197 lb (89.4 kg)  07/04/21 202 lb (91.6 kg)  01/17/21 210 lb 6.4 oz (95.4 kg)    Physical Exam  Lab Results  Component Value Date   WBC 9.3 09/13/2021   HGB 16.3 (H) 09/13/2021   HCT 48.5 (H) 09/13/2021   PLT 237.0 09/13/2021   GLUCOSE 225 (H) 09/13/2021   CHOL 183 07/04/2021   TRIG 113.0 07/04/2021   HDL 42.00 07/04/2021   LDLCALC 118 (H) 07/04/2021   ALT 33 09/13/2021   AST 29 09/13/2021   NA 136 09/13/2021   K 3.7 09/13/2021   CL 96 09/13/2021   CREATININE 0.95 09/13/2021   BUN 21 09/13/2021   CO2 30 09/13/2021   TSH 1.85 06/14/2020  INR 1.0 09/13/2021   HGBA1C 13.3 (H) 09/13/2021   MICROALBUR 0.9 07/04/2021    CT Chest W Contrast  Result Date: 10/14/2021 CLINICAL DATA:  Lung nodule. Intermittent abdominal pain with nausea, vomiting, and weight loss. EXAM: CT CHEST, ABDOMEN, AND PELVIS WITH CONTRAST TECHNIQUE: Multidetector CT imaging of the chest, abdomen and pelvis was performed following the standard protocol during bolus administration of intravenous contrast. RADIATION DOSE REDUCTION: This exam was performed according to the departmental dose-optimization program which includes automated exposure control, adjustment of the mA and/or kV according to patient size and/or use of iterative reconstruction technique. CONTRAST:  134m ISOVUE-300 IOPAMIDOL (ISOVUE-300) INJECTION 61% COMPARISON:  07/15/2020, 07/08/2015. FINDINGS: CT CHEST FINDINGS Cardiovascular: The heart is normal in size and there is no pericardial effusion. The aorta and  pulmonary trunk are normal in caliber. Mediastinum/Nodes: No enlarged mediastinal, hilar, or axillary lymph nodes. Thyroid gland, trachea, and esophagus demonstrate no significant findings. Lungs/Pleura: Lungs are clear. No pleural effusion or pneumothorax. A stable 6 mm nodule is present in the right lower lobe, axial image 93. Musculoskeletal: Mild degenerative changes in the thoracic spine. No acute osseous abnormality. CT ABDOMEN PELVIS FINDINGS Hepatobiliary: No focal liver abnormality is seen. Hepatic steatosis is noted. No gallstones, gallbladder wall thickening, or biliary dilatation. Pancreas: Unremarkable. No pancreatic ductal dilatation or surrounding inflammatory changes. Spleen: Normal in size without focal abnormality. Adrenals/Urinary Tract: The right adrenal gland is within normal limits. There is a stable 1.1 cm left adrenal nodule, previously characterized as an adenoma. The kidneys enhance symmetrically. Contrast is noted in the collecting systems bilaterally. No hydronephrosis. The bladder is within normal limits. Stomach/Bowel: Stomach is within normal limits. Appendix appears normal. No evidence of bowel wall thickening, distention, or inflammatory changes. No free air or pneumatosis. Vascular/Lymphatic: No significant vascular findings are present. No enlarged abdominal or pelvic lymph nodes. Reproductive: Status post hysterectomy. No adnexal masses. Other: No abdominal wall hernia or abnormality. No abdominopelvic ascites. Musculoskeletal: Degenerative changes are present in the lumbar spine. No acute osseous abnormality. IMPRESSION: 1. Stable 6 mm ground-glass nodule in the right lower lobe. Non-contrast chest CT at 6-12 months is recommended. If the nodule is stable at time of repeat CT, then future CT at 18-24 months (from today's scan) is considered optional for low-risk patients, but is recommended for high-risk patients. This recommendation follows the consensus statement: Guidelines  for Management of Incidental Pulmonary Nodules Detected on CT Images: From the Fleischner Society 2017; Radiology 2017; 284:228-243. 2. No abnormality to explain patient's reported abdominal pain. 3. Hepatic steatosis. 4. Stable left adrenal adenoma. Electronically Signed   By: LBrett FairyM.D.   On: 10/14/2021 02:24   CT Abdomen Pelvis W Contrast  Result Date: 10/14/2021 CLINICAL DATA:  Lung nodule. Intermittent abdominal pain with nausea, vomiting, and weight loss. EXAM: CT CHEST, ABDOMEN, AND PELVIS WITH CONTRAST TECHNIQUE: Multidetector CT imaging of the chest, abdomen and pelvis was performed following the standard protocol during bolus administration of intravenous contrast. RADIATION DOSE REDUCTION: This exam was performed according to the departmental dose-optimization program which includes automated exposure control, adjustment of the mA and/or kV according to patient size and/or use of iterative reconstruction technique. CONTRAST:  1053mISOVUE-300 IOPAMIDOL (ISOVUE-300) INJECTION 61% COMPARISON:  07/15/2020, 07/08/2015. FINDINGS: CT CHEST FINDINGS Cardiovascular: The heart is normal in size and there is no pericardial effusion. The aorta and pulmonary trunk are normal in caliber. Mediastinum/Nodes: No enlarged mediastinal, hilar, or axillary lymph nodes. Thyroid gland, trachea, and esophagus demonstrate no significant  findings. Lungs/Pleura: Lungs are clear. No pleural effusion or pneumothorax. A stable 6 mm nodule is present in the right lower lobe, axial image 93. Musculoskeletal: Mild degenerative changes in the thoracic spine. No acute osseous abnormality. CT ABDOMEN PELVIS FINDINGS Hepatobiliary: No focal liver abnormality is seen. Hepatic steatosis is noted. No gallstones, gallbladder wall thickening, or biliary dilatation. Pancreas: Unremarkable. No pancreatic ductal dilatation or surrounding inflammatory changes. Spleen: Normal in size without focal abnormality. Adrenals/Urinary Tract: The  right adrenal gland is within normal limits. There is a stable 1.1 cm left adrenal nodule, previously characterized as an adenoma. The kidneys enhance symmetrically. Contrast is noted in the collecting systems bilaterally. No hydronephrosis. The bladder is within normal limits. Stomach/Bowel: Stomach is within normal limits. Appendix appears normal. No evidence of bowel wall thickening, distention, or inflammatory changes. No free air or pneumatosis. Vascular/Lymphatic: No significant vascular findings are present. No enlarged abdominal or pelvic lymph nodes. Reproductive: Status post hysterectomy. No adnexal masses. Other: No abdominal wall hernia or abnormality. No abdominopelvic ascites. Musculoskeletal: Degenerative changes are present in the lumbar spine. No acute osseous abnormality. IMPRESSION: 1. Stable 6 mm ground-glass nodule in the right lower lobe. Non-contrast chest CT at 6-12 months is recommended. If the nodule is stable at time of repeat CT, then future CT at 18-24 months (from today's scan) is considered optional for low-risk patients, but is recommended for high-risk patients. This recommendation follows the consensus statement: Guidelines for Management of Incidental Pulmonary Nodules Detected on CT Images: From the Fleischner Society 2017; Radiology 2017; 284:228-243. 2. No abnormality to explain patient's reported abdominal pain. 3. Hepatic steatosis. 4. Stable left adrenal adenoma. Electronically Signed   By: Brett Fairy M.D.   On: 10/14/2021 02:24    Assessment & Plan:   There are no diagnoses linked to this encounter. I am having Kathryn Parks maintain her fexofenadine, zinc gluconate, acetaminophen, Cholecalciferol, amitriptyline, gabapentin, valACYclovir, Soliqua, Insulin Pen Needle, atorvastatin, pantoprazole, fluticasone, FreeStyle Libre 2 Reader, FreeStyle Libre 2 Sensor, NovoLOG FlexPen, torsemide, amLODipine, carvedilol, and triamcinolone cream.  No orders of the defined  types were placed in this encounter.    Follow-up: No follow-ups on file.  [name], MD

## 2021-11-14 NOTE — Patient Instructions (Signed)

## 2021-11-15 LAB — PTH, INTACT AND CALCIUM
Calcium: 10.3 mg/dL (ref 8.6–10.4)
PTH: 50 pg/mL (ref 16–77)

## 2021-11-18 ENCOUNTER — Other Ambulatory Visit: Payer: Self-pay | Admitting: Internal Medicine

## 2021-11-18 ENCOUNTER — Telehealth: Payer: Self-pay | Admitting: Internal Medicine

## 2021-11-18 DIAGNOSIS — I10 Essential (primary) hypertension: Secondary | ICD-10-CM

## 2021-11-18 MED ORDER — TRIAMTERENE-HCTZ 37.5-25 MG PO CAPS: 1.0000 | ORAL_CAPSULE | Freq: Every day | ORAL | 0 refills | Status: AC

## 2021-11-18 NOTE — Telephone Encounter (Signed)
Patient states that ever since she started taking  for blood pressure she has been having terrible pain in her legs, especially at night. She wants to know if she can switch back to her previous medication.  Patient would also like results from recent lab work.  Call back number is [phone]

## 2021-11-18 NOTE — Telephone Encounter (Signed)
  amLODipine (NORVASC) 5 MG tablet is the name of the new medication that she believes is causing the leg pain.

## 2021-11-18 NOTE — Telephone Encounter (Signed)
Pt has been informed to d/c amlodipine and informed of lab results from 9/18.  She is inquiring if she could be put back on triamterene-hydrochlorothiazide since that was working well for her. Please advise.

## 2021-11-23 NOTE — Telephone Encounter (Signed)
Pt is requesting an update about whether she should take her triamterene-hydrochlorothiazide since she has stopped taking the amlodipine. She hasn't been taking any BP meds and she is having headaches, she would like a call back so she knows what to do.

## 2021-11-23 NOTE — Telephone Encounter (Signed)
Pt has been informed and expressed understanding.  

## 2021-12-08 ENCOUNTER — Ambulatory Visit
Admission: RE | Admit: 2021-12-08 | Discharge: 2021-12-08 | Disposition: A | Discharge status: Home / Self Care | Payer: Commercial Managed Care - HMO | Source: Ambulatory Visit | Attending: Internal Medicine | Admitting: Internal Medicine

## 2021-12-08 DIAGNOSIS — R1012 Left upper quadrant pain: Secondary | ICD-10-CM

## 2021-12-08 DIAGNOSIS — R109 Unspecified abdominal pain: Secondary | ICD-10-CM

## 2021-12-08 DIAGNOSIS — R3121 Asymptomatic microscopic hematuria: Secondary | ICD-10-CM

## 2021-12-09 ENCOUNTER — Telehealth: Payer: Self-pay | Admitting: Internal Medicine

## 2021-12-09 NOTE — Telephone Encounter (Signed)
Joy from [facility] [facility] called and said patient was wanting to get a refill on Meclizine for dizziness. We dont have an active prescription on file but it does look like Dr Ronnald Ramp has filled in the past. Would he fill or does patient need an appointment. Please advise

## 2021-12-12 ENCOUNTER — Telehealth: Payer: Self-pay | Admitting: Internal Medicine

## 2021-12-12 NOTE — Telephone Encounter (Signed)
Patient is still having headaches - she thinks her blood pressure medication needs adjusted.  Patient also would like results from her CT scan - Please call after 1:00 pm.

## 2021-12-12 NOTE — Telephone Encounter (Signed)
I scheduled her with Harland Dingwall at 1:00 pm on Wednesday - You could get Larene Beach to add this to Dr. Ronnald Ramp - I can't do this.  She would also like someone to call her today with her CT scan results.

## 2021-12-12 NOTE — Telephone Encounter (Signed)
Pt has been informed of CT results but she expressed that she is still experiencing pain and is unsure of what to do. She would like to know what her options are now. She would also like to discuss her BP and medications again due to her Bp not being regulated.   Pt has an OV on 10/18 with Harland Dingwall to discuss BP.

## 2021-12-14 ENCOUNTER — Other Ambulatory Visit: Payer: Self-pay | Admitting: Internal Medicine

## 2021-12-14 ENCOUNTER — Telehealth: Payer: Self-pay

## 2021-12-14 ENCOUNTER — Ambulatory Visit (INDEPENDENT_AMBULATORY_CARE_PROVIDER_SITE_OTHER): Payer: Commercial Managed Care - HMO | Admitting: Family Medicine

## 2021-12-14 VITALS — BP 148/96 | HR 97 | Temp 97.6°F | Ht 65.0 in | Wt 201.0 lb

## 2021-12-14 DIAGNOSIS — I10 Essential (primary) hypertension: Secondary | ICD-10-CM | POA: Diagnosis not present

## 2021-12-14 DIAGNOSIS — E119 Type 2 diabetes mellitus without complications: Secondary | ICD-10-CM

## 2021-12-14 DIAGNOSIS — Z794 Long term (current) use of insulin: Secondary | ICD-10-CM | POA: Diagnosis not present

## 2021-12-14 DIAGNOSIS — J301 Allergic rhinitis due to pollen: Secondary | ICD-10-CM

## 2021-12-14 NOTE — Telephone Encounter (Signed)
[facility] called and said that the prescriptions will need a prior authorization,  insulin aspart (NOVOLOG FLEXPEN) 100 UNIT/ML FlexPen and Insulin Glargine-Lixisenatide (SOLIQUA) 100-33 UNT-MCG/ML SOPN

## 2021-12-14 NOTE — Progress Notes (Signed)
Subjective:     [name], female    DOB: [birth year], 61 y.o.   MRN: [id number]  Chief Complaint  Patient presents with   Hypertension    Discuss BP, yesterday BP was 160/110 when she was at nursing home. Mentions she has been having headaches when her BP is up, recent switch in medications    HPI Patient is in today for elevated BP yesterday and had headaches last week. Denies headache today. Reports good compliance with triamterene- HCTZ 37.5-25 mg daily and carvedilol 3.125 mg daily.   States her legs were hurting and feet tingling.   Diabetes- states she was not aware that she actually had diabetes. States she has never used the pens or been on medication.   Does not check blood sugars.   Denies fever, chills, dizziness, chest pain, palpitations, shortness of breath, abdominal pain, N/V/D, LE edema.    Health Maintenance Due  Topic Date Due   Zoster Vaccines- Shingrix (1 of 2) Never done    Past Medical History:  Diagnosis Date   Arthritis    DDD (degenerative disc disease), lumbar    Depression    Hypertension    Pre-diabetes     Past Surgical History:  Procedure Laterality Date   DILATION AND CURETTAGE, DIAGNOSTIC / THERAPEUTIC     x2   ROBOTIC ASSISTED TOTAL HYSTERECTOMY WITH BILATERAL SALPINGO OOPHERECTOMY Bilateral 10/31/2019   Procedure: XI ROBOTIC ASSISTED TOTAL HYSTERECTOMY WITH BILATERAL SALPINGO OOPHORECTOMY;  Surgeon: Aloha Gell, MD;  Location: [facility];  Service: Gynecology;  Laterality: Bilateral;   TUBAL LIGATION  1997    Family History  Problem Relation Age of Onset   Heart disease Father    Hypertension Father    Diabetes Father    Diabetes Mother    Birth defects Neg Hx    Cancer Neg Hx    Alcohol abuse Neg Hx    Drug abuse Neg Hx    Early death Neg Hx    Hyperlipidemia Neg Hx    Kidney disease Neg Hx    Stroke Neg Hx    Colon cancer Neg Hx     Social History   Socioeconomic History    Marital status: Widowed    Spouse name: Not on file   Number of children: Not on file   Years of education: Not on file   Highest education level: Not on file  Occupational History   Not on file  Tobacco Use   Smoking status: Never   Smokeless tobacco: Never  Vaping Use   Vaping Use: Never used  Substance and Sexual Activity   Alcohol use: No    Alcohol/week: 0.0 standard drinks of alcohol    Comment: Ocassionally   Drug use: No   Sexual activity: Not Currently    Partners: Male    Birth control/protection: Surgical    Comment: Tubal Ligation   Other Topics Concern   Not on file  Social History Narrative   Not on file   Social Determinants of Health   Financial Resource Strain: Not on file  Food Insecurity: Not on file  Transportation Needs: Not on file  Physical Activity: Not on file  Stress: Not on file  Social Connections: Not on file  Intimate Partner Violence: Not on file    Outpatient Medications Prior to Visit  Medication Sig Dispense Refill   acetaminophen (TYLENOL) 500 MG tablet Take 2 tablets (1,000 mg total) by mouth every 6 (six) hours. [place]  tablet 1   amitriptyline (ELAVIL) 75 MG tablet Take 75 mg by mouth daily. Take 1 by mouth daily     atorvastatin (LIPITOR) 40 MG tablet TAKE 1 TABLET BY MOUTH EVERY DAY 90 tablet 1   carvedilol (COREG) 3.125 MG tablet Take 1 tablet by mouth 2 times daily with a meal. 180 tablet 1   Cholecalciferol 50 MCG (2000 UT) TABS Take 1 tablet (2,000 Units total) by mouth daily. 90 tablet 3   Continuous Blood Gluc Receiver (FREESTYLE LIBRE 2 READER) DEVI 1 Act by Does not apply route daily. 2 each 5   Continuous Blood Gluc Sensor (FREESTYLE LIBRE 2 SENSOR) MISC 1 Act by Does not apply route daily. 2 each 5   fexofenadine (ALLEGRA) 180 MG tablet Take 180 mg by mouth daily.     gabapentin (NEURONTIN) 100 MG capsule Take 200 mg by mouth 3 (three) times daily. Take 200 mg by mouth 3 (three) times daily.     Insulin Pen Needle 32G X 6 MM  MISC 1 Act by Does not apply route daily. 100 each 1   pantoprazole (PROTONIX) 40 MG tablet Take 1 tablet by mouth daily. 90 tablet 1   triamcinolone cream (KENALOG) 0.5 % apply 1 application to affected area 3 times daily 60 g 1   triamterene-hydrochlorothiazide (DYAZIDE) 37.5-25 MG capsule Take 1 each (1 capsule total) by mouth daily. 90 capsule 0   valACYclovir (VALTREX) 1000 MG tablet take 1/2 tablet by mouth daily as needed for (cold Sore) 90 tablet 1   zinc gluconate 50 MG tablet Take 50 mg by mouth daily.     fluticasone (FLONASE) 50 MCG/ACT nasal spray Place 2 sprays into both nostrils daily. 48 mL 1   insulin aspart (NOVOLOG FLEXPEN) 100 UNIT/ML FlexPen Inject 10 Units into the skin 3 (three) times daily with meals. (Patient not taking: Reported on 12/14/2021) 27 mL 1   Insulin Glargine-Lixisenatide (SOLIQUA) 100-33 UNT-MCG/ML SOPN Inject 60 Units into the skin daily. (Patient not taking: Reported on 12/14/2021) 54 mL 1   No facility-administered medications prior to visit.    Allergies  Allergen Reactions   Metformin And Related Diarrhea    ROS     Objective:    Physical Exam Constitutional:      General: She is not in acute distress.    Appearance: She is not ill-appearing.  Eyes:     Extraocular Movements: Extraocular movements intact.     Conjunctiva/sclera: Conjunctivae normal.     Pupils: Pupils are equal, round, and reactive to light.  Cardiovascular:     Rate and Rhythm: Normal rate and regular rhythm.  Pulmonary:     Effort: Pulmonary effort is normal.     Breath sounds: Normal breath sounds.  Musculoskeletal:     Right lower leg: No edema.     Left lower leg: No edema.  Skin:    General: Skin is warm and dry.  Neurological:     General: No focal deficit present.     Mental Status: She is alert and oriented to person, place, and time.  Psychiatric:        Mood and Affect: Mood normal.        Behavior: Behavior normal.     BP (!) 148/96 (BP Location:  Left Arm, Patient Position: Sitting, Cuff Size: Large)   Pulse 97   Temp 97.6 F (36.4 C) (Temporal)   Ht '5\' 5"'$  (1.651 m)   Wt 201 lb (91.2 kg)   LMP  (  LMP Unknown)   SpO2 97%   BMI 33.45 kg/m  Wt Readings from Last 3 Encounters:  12/14/21 201 lb (91.2 kg)  11/14/21 201 lb (91.2 kg)  09/13/21 197 lb (89.4 kg)       Assessment & Plan:   Problem List Items Addressed This Visit       Cardiovascular and Mediastinum   Essential hypertension - Primary     Endocrine   Insulin-requiring or dependent type II diabetes mellitus ([place])   She was seen today for an acute visit regarding recently elevated blood pressures.  Counseling on good compliance of medications and low-sodium diet.  DASH diet handout provided.  Continue monitoring blood pressure at home. She reports not being aware of the severity of her diabetes and has not been taking medication for it.  Discussed that her diabetes is uncontrolled and most likely responsible for numbness and tingling in her hands and feet.  Discussed potential health consequences associated with uncontrolled diabetes.  Recommend she check with her pharmacy and start taking medications prescribed by her PCP.  She appeared to understand the importance of this. Follow-up with PCP in 4 weeks to ensure good compliance and improvement of blood pressure and diabetes.  I am having Kathryn Parks maintain her fexofenadine, zinc gluconate, acetaminophen, Cholecalciferol, amitriptyline, gabapentin, valACYclovir, Soliqua, Insulin Pen Needle, atorvastatin, pantoprazole, FreeStyle Libre 2 Reader, FreeStyle Libre 2 Sensor, NovoLOG FlexPen, carvedilol, triamcinolone cream, and triamterene-hydrochlorothiazide.  No orders of the defined types were placed in this encounter.

## 2021-12-14 NOTE — Patient Instructions (Addendum)
Please check with your pharmacy and get the medications prescribed for your diabetes as well as the Freestyle Libre 2 to check your blood sugars.   You should be taking a long acting insulin called Soliqua and meal time insulin called Novolog. These are both pens. Ask the pharmacy to show you how to use them or schedule a nurse visit to come in here to learn how to use them.   Continue your triamterene- HCTZ 37.5-25 mg and carvedilol 3.125 mg for your blood pressure.   Follow up with Dr. Ronnald Ramp in 4 weeks for diabetes and hypertension.    DASH Eating Plan DASH stands for Dietary Approaches to Stop Hypertension. The DASH eating plan is a healthy eating plan that has been shown to: Reduce high blood pressure (hypertension). Reduce your risk for type 2 diabetes, heart disease, and stroke. Help with weight loss. What are tips for following this plan? Reading food labels Check food labels for the amount of salt (sodium) per serving. Choose foods with less than 5 percent of the Daily Value of sodium. Generally, foods with less than 300 milligrams (mg) of sodium per serving fit into this eating plan. To find whole grains, look for the word "whole" as the first word in the ingredient list. Shopping Buy products labeled as "low-sodium" or "no salt added." Buy fresh foods. Avoid canned foods and pre-made or frozen meals. Cooking Avoid adding salt when cooking. Use salt-free seasonings or herbs instead of table salt or sea salt. Check with your health care provider or pharmacist before using salt substitutes. Do not fry foods. Cook foods using healthy methods such as baking, boiling, grilling, roasting, and broiling instead. Cook with heart-healthy oils, such as olive, canola, avocado, soybean, or sunflower oil. Meal planning  Eat a balanced diet that includes: 4 or more servings of fruits and 4 or more servings of vegetables each day. Try to fill one-half of your plate with fruits and  vegetables. 6-8 servings of whole grains each day. Less than 6 oz (170 g) of lean meat, poultry, or fish each day. A 3-oz (85-g) serving of meat is about the same size as a deck of cards. One egg equals 1 oz (28 g). 2-3 servings of low-fat dairy each day. One serving is 1 cup (237 mL). 1 serving of nuts, seeds, or beans 5 times each week. 2-3 servings of heart-healthy fats. Healthy fats called omega-3 fatty acids are found in foods such as walnuts, flaxseeds, fortified milks, and eggs. These fats are also found in cold-water fish, such as sardines, salmon, and mackerel. Limit how much you eat of: Canned or prepackaged foods. Food that is high in trans fat, such as some fried foods. Food that is high in saturated fat, such as fatty meat. Desserts and other sweets, sugary drinks, and other foods with added sugar. Full-fat dairy products. Do not salt foods before eating. Do not eat more than 4 egg yolks a week. Try to eat at least 2 vegetarian meals a week. Eat more home-cooked food and less restaurant, buffet, and fast food. Lifestyle When eating at a restaurant, ask that your food be prepared with less salt or no salt, if possible. If you drink alcohol: Limit how much you use to: 0-1 drink a day for women who are not pregnant. 0-2 drinks a day for men. Be aware of how much alcohol is in your drink. In the [place] one drink equals one 12 oz bottle of beer (355 mL), one 5 oz  glass of wine (148 mL), or one 1 oz glass of hard liquor (44 mL). General information Avoid eating more than 2,300 mg of salt a day. If you have hypertension, you may need to reduce your sodium intake to 1,500 mg a day. Work with your health care provider to maintain a healthy body weight or to lose weight. Ask what an ideal weight is for you. Get at least 30 minutes of exercise that causes your heart to beat faster (aerobic exercise) most days of the week. Activities may include walking, swimming, or biking. Work with  your health care provider or dietitian to adjust your eating plan to your individual calorie needs. What foods should I eat? Fruits All fresh, dried, or frozen fruit. Canned fruit in natural juice (without added sugar). Vegetables Fresh or frozen vegetables (raw, steamed, roasted, or grilled). Low-sodium or reduced-sodium tomato and vegetable juice. Low-sodium or reduced-sodium tomato sauce and tomato paste. Low-sodium or reduced-sodium canned vegetables. Grains Whole-grain or whole-wheat bread. Whole-grain or whole-wheat pasta. Brown rice. [place]. Bulgur. Whole-grain and low-sodium cereals. Pita bread. Low-fat, low-sodium crackers. Whole-wheat flour tortillas. Meats and other proteins Skinless chicken or [place]. Ground chicken or [place]. Pork with fat trimmed off. Fish and seafood. Egg whites. Dried beans, peas, or lentils. Unsalted nuts, nut butters, and seeds. Unsalted canned beans. Lean cuts of beef with fat trimmed off. Low-sodium, lean precooked or cured meat, such as sausages or meat loaves. Dairy Low-fat (1%) or fat-free (skim) milk. Reduced-fat, low-fat, or fat-free cheeses. Nonfat, low-sodium ricotta or cottage cheese. Low-fat or nonfat yogurt. Low-fat, low-sodium cheese. Fats and oils Soft margarine without trans fats. Vegetable oil. Reduced-fat, low-fat, or light mayonnaise and salad dressings (reduced-sodium). Canola, safflower, olive, avocado, soybean, and sunflower oils. Avocado. Seasonings and condiments Herbs. Spices. Seasoning mixes without salt. Other foods Unsalted popcorn and pretzels. Fat-free sweets. The items listed above may not be a complete list of foods and beverages you can eat. Contact a dietitian for more information. What foods should I avoid? Fruits Canned fruit in a light or heavy syrup. Fried fruit. Fruit in cream or butter sauce. Vegetables Creamed or fried vegetables. Vegetables in a cheese sauce. Regular canned vegetables (not low-sodium or  reduced-sodium). Regular canned tomato sauce and paste (not low-sodium or reduced-sodium). Regular tomato and vegetable juice (not low-sodium or reduced-sodium). Angie Fava. Olives. Grains Baked goods made with fat, such as croissants, muffins, or some breads. Dry pasta or rice meal packs. Meats and other proteins Fatty cuts of meat. Ribs. Fried meat. Berniece Salines. Bologna, salami, and other precooked or cured meats, such as sausages or meat loaves. Fat from the back of a pig (fatback). Bratwurst. Salted nuts and seeds. Canned beans with added salt. Canned or smoked fish. Whole eggs or egg yolks. Chicken or [place] with skin. Dairy Whole or 2% milk, cream, and half-and-half. Whole or full-fat cream cheese. Whole-fat or sweetened yogurt. Full-fat cheese. Nondairy creamers. Whipped toppings. Processed cheese and cheese spreads. Fats and oils Butter. Stick margarine. Lard. Shortening. Ghee. Bacon fat. Tropical oils, such as coconut, palm kernel, or palm oil. Seasonings and condiments Onion salt, garlic salt, seasoned salt, table salt, and sea salt. Worcestershire sauce. Tartar sauce. Barbecue sauce. Teriyaki sauce. Soy sauce, including reduced-sodium. Steak sauce. Canned and packaged gravies. Fish sauce. Oyster sauce. Cocktail sauce. Store-bought horseradish. Ketchup. Mustard. Meat flavorings and tenderizers. Bouillon cubes. Hot sauces. Pre-made or packaged marinades. Pre-made or packaged taco seasonings. Relishes. Regular salad dressings. Other foods Salted popcorn and pretzels. The items listed above  may not be a complete list of foods and beverages you should avoid. Contact a dietitian for more information. Where to find more information National Heart, Lung, and Blood Institute: https://wilson-eaton.com/ American Heart Association: www.heart.org Academy of Nutrition and Dietetics: www.eatright.[place]: www.kidney.org Summary The DASH eating plan is a healthy eating plan that has been shown  to reduce high blood pressure (hypertension). It may also reduce your risk for type 2 diabetes, heart disease, and stroke. When on the DASH eating plan, aim to eat more fresh fruits and vegetables, whole grains, lean proteins, low-fat dairy, and heart-healthy fats. With the DASH eating plan, you should limit salt (sodium) intake to 2,300 mg a day. If you have hypertension, you may need to reduce your sodium intake to 1,500 mg a day. Work with your health care provider or dietitian to adjust your eating plan to your individual calorie needs. This information is not intended to replace advice given to you by your health care provider. Make sure you discuss any questions you have with your health care provider. Document Revised: 01/17/2019 Document Reviewed: 01/17/2019 Elsevier Patient Education  [place].

## 2021-12-15 NOTE — Telephone Encounter (Signed)
Soliqua Key: [id number] Approved until 12/15/2022  Novolog Key: [id number] Approved until 12/15/2022

## 2021-12-15 NOTE — Telephone Encounter (Addendum)
Patient's medication is still way to high, Pharmacy states patient wants Metformin. Asked for [place] to contact the patient and follow up with her.

## 2021-12-16 ENCOUNTER — Other Ambulatory Visit: Payer: Self-pay | Admitting: Internal Medicine

## 2021-12-19 ENCOUNTER — Other Ambulatory Visit: Payer: Self-pay | Admitting: Internal Medicine

## 2021-12-19 ENCOUNTER — Telehealth: Payer: Self-pay | Admitting: Internal Medicine

## 2021-12-19 DIAGNOSIS — E119 Type 2 diabetes mellitus without complications: Secondary | ICD-10-CM

## 2021-12-19 MED ORDER — BASAGLAR KWIKPEN 100 UNIT/ML ~~LOC~~ SOPN: 30.0000 [IU] | PEN_INJECTOR | Freq: Every day | SUBCUTANEOUS | 0 refills | Status: DC

## 2021-12-19 NOTE — Telephone Encounter (Signed)
Please advise 

## 2021-12-19 NOTE — Telephone Encounter (Signed)
Dr. Ronnald Ramp called in the nova log flex pen and soliqua - patient states that these are too expensive and would like something else sent in to [facility].

## 2021-12-19 NOTE — Telephone Encounter (Signed)
Pt called to request metformin sent to [facility].

## 2021-12-20 NOTE — Telephone Encounter (Signed)
Called pt, LVM to discuss.  

## 2021-12-20 NOTE — Telephone Encounter (Signed)
Patient called back about the metformin - she does not want to take any insulin at all.

## 2021-12-21 ENCOUNTER — Other Ambulatory Visit: Payer: Self-pay | Admitting: Internal Medicine

## 2021-12-21 DIAGNOSIS — E119 Type 2 diabetes mellitus without complications: Secondary | ICD-10-CM

## 2021-12-21 MED ORDER — METFORMIN HCL ER 750 MG PO TB24: 750.0000 mg | ORAL_TABLET | Freq: Every day | ORAL | 0 refills | Status: AC

## 2021-12-21 NOTE — Telephone Encounter (Signed)
PT visits today with a Wellness Screening Form to be filled out by Dr.Jones. PT would like to be notified once this form is finished. Form has been placed in Dr.Jones' mailbox.  PT was also inquiring on the determination of intolerance for the Metformin. She wasn't sure as to why this was noted as such and was wanting to speak with someone regarding this.  CB: [phone]

## 2021-12-22 ENCOUNTER — Other Ambulatory Visit: Payer: Self-pay | Admitting: Internal Medicine

## 2021-12-22 DIAGNOSIS — L2084 Intrinsic (allergic) eczema: Secondary | ICD-10-CM

## 2021-12-27 ENCOUNTER — Telehealth: Payer: Self-pay | Admitting: Internal Medicine

## 2021-12-27 NOTE — Telephone Encounter (Signed)
Pt has been informed that Rx would not cause tingling per verbal conversation with PCP.   She declined neurology referral at this time as she wants to work on lifestyle changes and continue to use her insulin.  She has been informed that biometrics screenings was completed and faxed. Pt requested a copy be left up front for pick up.

## 2021-12-27 NOTE — Telephone Encounter (Signed)
Patient is still having tingling in legs - she wants to know if maybe her carvedilol needs to be adjusted.  Patient is also checking about her biometrics form.  Please advise.

## 2021-12-29 ENCOUNTER — Telehealth: Payer: Self-pay | Admitting: Internal Medicine

## 2021-12-29 NOTE — Telephone Encounter (Signed)
Dan from [employer] called as well and they wanted to know if they can refile the claim as an annual physical. Call back is [phone] Reference #: [id number]

## 2021-12-29 NOTE — Telephone Encounter (Signed)
Patient called in and said that her appointment for 11/14/21 was supposed to be for a physical for her insurance and that's how it was scheduled, She said it was coded as an office visit and she would like someone to look into it and call her back at [phone].

## 2022-01-03 NOTE — Telephone Encounter (Signed)
Pt called to report the forms filled out did not have the correct  wellness code . Please investigate and change the code for the visit to a wellness code. Please call pt for questions: [phone]

## 2022-01-04 NOTE — Telephone Encounter (Signed)
Pt has called back and been informed of PCPs response. Pt vicoe that she was upset because she called in to schedule a CPE and that was what she was expecting. I explained to her that if it is any concerns discussed outside of a physical it is up to the providers discretion to code it as such. She stated she wanted her upcoming OV on 11/29 to not be a 4w follow up and to be changed to a CPE. CMA has changed the 11/29 OV note to a CPE but I did reiterate the above statement to pt.

## 2022-01-04 NOTE — Telephone Encounter (Signed)
LVM explaining that PCP was unable to change code due to the visit being a sick complicated visit.

## 2022-01-23 ENCOUNTER — Telehealth: Payer: Self-pay | Admitting: Internal Medicine

## 2022-01-23 DIAGNOSIS — E119 Type 2 diabetes mellitus without complications: Secondary | ICD-10-CM

## 2022-01-23 MED ORDER — INSULIN PEN NEEDLE 32G X 6 MM MISC: 3.0000 | Freq: Every day | 1 refills | Status: DC

## 2022-01-23 NOTE — Telephone Encounter (Signed)
I spoke with a representative at [facility] today regarding one of the prescriptions for Kathryn Parks. They were recommending Kathryn Parks's RX for Insulin Pen Needle 32G X 6 MM MISC be re-written to state that Kathryn Parks is using 3 needles a day. She stated the way that it was currently written it made it look like Kathryn Parks was only using one needle a day (where she is currently using three).

## 2022-01-23 NOTE — Telephone Encounter (Signed)
Rx has been updated for using 3 times a day.

## 2022-01-25 ENCOUNTER — Ambulatory Visit (INDEPENDENT_AMBULATORY_CARE_PROVIDER_SITE_OTHER): Payer: Commercial Managed Care - HMO | Admitting: Internal Medicine

## 2022-01-25 ENCOUNTER — Telehealth: Payer: Self-pay | Admitting: Internal Medicine

## 2022-01-25 ENCOUNTER — Encounter: Payer: Self-pay | Admitting: Internal Medicine

## 2022-01-25 VITALS — BP 142/92 | HR 83 | Temp 98.3°F | Ht 65.0 in | Wt 209.0 lb

## 2022-01-25 DIAGNOSIS — I10 Essential (primary) hypertension: Secondary | ICD-10-CM | POA: Diagnosis not present

## 2022-01-25 DIAGNOSIS — Z0001 Encounter for general adult medical examination with abnormal findings: Secondary | ICD-10-CM | POA: Insufficient documentation

## 2022-01-25 DIAGNOSIS — Z794 Long term (current) use of insulin: Secondary | ICD-10-CM

## 2022-01-25 DIAGNOSIS — E118 Type 2 diabetes mellitus with unspecified complications: Secondary | ICD-10-CM | POA: Diagnosis not present

## 2022-01-25 DIAGNOSIS — E119 Type 2 diabetes mellitus without complications: Secondary | ICD-10-CM

## 2022-01-25 DIAGNOSIS — Z1211 Encounter for screening for malignant neoplasm of colon: Secondary | ICD-10-CM

## 2022-01-25 DIAGNOSIS — Z1231 Encounter for screening mammogram for malignant neoplasm of breast: Secondary | ICD-10-CM

## 2022-01-25 DIAGNOSIS — Z23 Encounter for immunization: Secondary | ICD-10-CM

## 2022-01-25 MED ORDER — TRULICITY 0.75 MG/0.5ML ~~LOC~~ SOAJ: 0.7500 mg | SUBCUTANEOUS | 0 refills | Status: DC

## 2022-01-25 NOTE — Progress Notes (Signed)
Subjective:  [name], female    DOB: 11-12-1960  Age: 61 y.o. MRN: [id number]  CC: Annual Exam, Hypertension, and Diabetes   HPI Kathryn Parks presents for a CPX and f/up -  She complains of weight gain and would like to start using a GLP-1 agonist.  She tells me her blood pressure is improving.  She denies headache, blurred vision, chest pain, or shortness of breath.  Outpatient Medications Prior to Visit  Medication Sig Dispense Refill   acetaminophen (TYLENOL) 500 MG tablet Take 2 tablets (1,000 mg total) by mouth every 6 (six) hours. 60 tablet 1   amitriptyline (ELAVIL) 75 MG tablet Take 75 mg by mouth daily. Take 1 by mouth daily     atorvastatin (LIPITOR) 40 MG tablet TAKE 1 TABLET BY MOUTH EVERY DAY 90 tablet 1   carvedilol (COREG) 3.125 MG tablet Take 1 tablet by mouth 2 times daily with a meal. 180 tablet 1   Cholecalciferol 50 MCG (2000 UT) TABS Take 1 tablet (2,000 Units total) by mouth daily. 90 tablet 3   Continuous Blood Gluc Receiver (FREESTYLE LIBRE 2 READER) DEVI 1 Act by Does not apply route daily. 2 each 5   Continuous Blood Gluc Sensor (FREESTYLE LIBRE 2 SENSOR) MISC 1 Act by Does not apply route daily. 2 each 5   fexofenadine (ALLEGRA) 180 MG tablet Take 180 mg by mouth daily.     fluticasone (FLONASE) 50 MCG/ACT nasal spray Place 2 sprays into both nostrils daily. 48 g 1   gabapentin (NEURONTIN) 100 MG capsule Take 200 mg by mouth 3 (three) times daily. Take 200 mg by mouth 3 (three) times daily.     insulin aspart (NOVOLOG FLEXPEN) 100 UNIT/ML FlexPen Inject 10 Units into the skin 3 (three) times daily with meals. 27 mL 1   Insulin Glargine (BASAGLAR KWIKPEN) 100 UNIT/ML Inject 30 Units into the skin daily. 27 mL 0   Insulin Pen Needle 32G X 6 MM MISC 3 Act by Does not apply route daily. 100 each 1   metFORMIN (GLUCOPHAGE-XR) 750 MG 24 hr tablet Take 1 tablet (750 mg total) by mouth daily with breakfast. 90 tablet 0   pantoprazole (PROTONIX) 40  MG tablet Take 1 tablet by mouth daily. 90 tablet 1   triamcinolone cream (KENALOG) 0.5 % apply 1 application to affected area 3 times daily 60 g 1   triamterene-hydrochlorothiazide (DYAZIDE) 37.5-25 MG capsule Take 1 each (1 capsule total) by mouth daily. 90 capsule 0   valACYclovir (VALTREX) 1000 MG tablet take 1/2 tablet by mouth daily as needed for (cold Sore) 90 tablet 1   zinc gluconate 50 MG tablet Take 50 mg by mouth daily.     No facility-administered medications prior to visit.    ROS Review of Systems  Constitutional:  Positive for unexpected weight change. Negative for chills, diaphoresis and fatigue.  HENT: Negative.    Eyes: Negative.   Respiratory:  Negative for cough, chest tightness, shortness of breath and wheezing.   Cardiovascular:  Negative for chest pain, palpitations and leg swelling.  Gastrointestinal:  Negative for abdominal pain, diarrhea, nausea and vomiting.  Endocrine: Negative.   Genitourinary: Negative.  Negative for difficulty urinating.  Musculoskeletal: Negative.  Negative for arthralgias, joint swelling and myalgias.  Skin: Negative.   Neurological: Negative.  Negative for dizziness and weakness.  Hematological:  Negative for adenopathy. Does not bruise/bleed easily.  Psychiatric/Behavioral: Negative.      Objective:  BP (!) 142/92 (BP  Location: Right Arm, Patient Position: Sitting, Cuff Size: Large)   Pulse 83   Temp 98.3 F (36.8 C) (Oral)   Ht '5\' 5"'$  (1.651 m)   Wt 209 lb (94.8 kg)   LMP  (LMP Unknown)   SpO2 95%   BMI 34.78 kg/m   BP Readings from Last 3 Encounters:  01/25/22 (!) 142/92  12/14/21 (!) 148/96  11/14/21 (!) 146/98    Wt Readings from Last 3 Encounters:  01/25/22 209 lb (94.8 kg)  12/14/21 201 lb (91.2 kg)  11/14/21 201 lb (91.2 kg)    Physical Exam Vitals reviewed.  Constitutional:      Appearance: Normal appearance.  HENT:     Mouth/Throat:     Mouth: Mucous membranes are moist.  Eyes:     General: No  scleral icterus.    Conjunctiva/sclera: Conjunctivae normal.  Cardiovascular:     Rate and Rhythm: Normal rate and regular rhythm.     Heart sounds: No murmur heard. Pulmonary:     Effort: Pulmonary effort is normal.     Breath sounds: No stridor. No wheezing, rhonchi or rales.  Abdominal:     General: Abdomen is flat.     Palpations: There is no mass.     Tenderness: There is no abdominal tenderness. There is no guarding.     Hernia: No hernia is present.  Musculoskeletal:        General: Normal range of motion.     Cervical back: Neck supple.     Right lower leg: No edema.     Left lower leg: No edema.  Lymphadenopathy:     Cervical: No cervical adenopathy.  Skin:    General: Skin is warm and dry.  Neurological:     General: No focal deficit present.     Mental Status: She is alert.  Psychiatric:        Mood and Affect: Mood normal.        Behavior: Behavior normal.     Lab Results  Component Value Date   WBC 9.3 11/14/2021   HGB 15.5 (H) 11/14/2021   HCT 46.0 11/14/2021   PLT 213.0 11/14/2021   GLUCOSE 296 (H) 11/14/2021   CHOL 196 11/14/2021   TRIG 96.0 11/14/2021   HDL 68.70 11/14/2021   LDLCALC 108 (H) 11/14/2021   ALT 31 11/14/2021   AST 24 11/14/2021   NA 136 11/14/2021   K 3.9 11/14/2021   CL 98 11/14/2021   CREATININE 0.91 11/14/2021   BUN 12 11/14/2021   CO2 28 11/14/2021   TSH 0.97 11/14/2021   INR 1.0 09/13/2021   HGBA1C 11.7 (H) 11/14/2021   MICROALBUR 0.9 07/04/2021    CT RENAL STONE STUDY  Result Date: 12/09/2021 CLINICAL DATA:  Left-sided pain, hematuria EXAM: CT ABDOMEN AND PELVIS WITHOUT CONTRAST TECHNIQUE: Multidetector CT imaging of the abdomen and pelvis was performed following the standard protocol without IV contrast. RADIATION DOSE REDUCTION: This exam was performed according to the departmental dose-optimization program which includes automated exposure control, adjustment of the mA and/or kV according to patient size and/or use of  iterative reconstruction technique. COMPARISON:  October 12, 2021 FINDINGS: Lower chest: No acute abnormality. Hepatobiliary: Diffuse low density of the liver is identified. No focal liver lesions. Gallbladder is normal. The biliary tree is normal. Pancreas: Unremarkable. No pancreatic ductal dilatation or surrounding inflammatory changes. Spleen: Normal in size without focal abnormality. Adrenals/Urinary Tract: 1.1 cm left low-density adrenal adenoma is identified unchanged compared to prior exams.  The right adrenal gland is normal. There is no kidney stones or hydronephrosis bilaterally. The bladder is normal. Stomach/Bowel: Stomach is within normal limits. The appendix is not seen but no inflammation is noted around cecum. No evidence of bowel wall thickening, distention, or inflammatory changes. Vascular/Lymphatic: No significant vascular findings are present. No enlarged abdominal or pelvic lymph nodes. Reproductive: Status post hysterectomy. No adnexal masses. Other: None. Musculoskeletal: Mild degenerative joint changes of the spine are noted. IMPRESSION: 1. No acute abnormality identified in the abdomen and pelvis. 2. No kidney stones or hydronephrosis bilaterally. 3. Fatty infiltration of liver. 4. Stable 1.1 cm left adrenal adenoma. Electronically Signed   By: Abelardo Diesel M.D.   On: 12/09/2021 17:47    Assessment & Plan:   Mirayah was seen today for annual exam, hypertension and diabetes.  Diagnoses and all orders for this visit:  Colon cancer screening -     Cologuard  Encounter for general adult medical examination with abnormal findings- Exam completed, labs reviewed, vaccines reviewed and updated, cancer screenings addressed, patient education was given.  Insulin-requiring or dependent type II diabetes mellitus ([place])  Type II diabetes mellitus with complication ([place])- Will add a GLP-1 agonist. -     Dulaglutide (TRULICITY) 0.25 EN/2.7PO SOPN; Inject 0.75 mg into the skin once a  week.  Visit for screening mammogram -     MM DIGITAL SCREENING BILATERAL; Future  Essential hypertension- Improvement noted.  Will continue the current meds and she will continue lifestyle modifications.  Varicella vaccine -     Zoster Vaccine Adjuvanted [facility]) injection; Inject 0.5 mLs into the muscle once for 1 dose.   I am having Kathryn Parks start on Trulicity and Shingrix. I am also having her maintain her fexofenadine, zinc gluconate, acetaminophen, Cholecalciferol, amitriptyline, gabapentin, valACYclovir, atorvastatin, pantoprazole, FreeStyle Libre 2 Reader, [employer] 2 Sensor, NovoLOG FlexPen, carvedilol, triamterene-hydrochlorothiazide, fluticasone, Basaglar KwikPen, metFORMIN, triamcinolone cream, and Insulin Pen Needle.  Meds ordered this encounter  Medications   Dulaglutide (TRULICITY) 2.42 PN/3.6RW SOPN    Sig: Inject 0.75 mg into the skin once a week.    Dispense:  2 mL    Refill:  0   Zoster Vaccine Adjuvanted [facility]) injection    Sig: Inject 0.5 mLs into the muscle once for 1 dose.    Dispense:  0.5 mL    Refill:  1     Follow-up: Return in about 3 months (around 04/27/2022).  [name], MD

## 2022-01-25 NOTE — Telephone Encounter (Signed)
Patient has been taking Soliqua in the past but it has been removed from her list of meds. She also stated that she now sees Basaglar as a current medication but she has never taken it before. Patient would like to confirm that she only needs to be taking [place] and that it will get added back to her list of current medications.

## 2022-01-25 NOTE — Patient Instructions (Signed)

## 2022-01-25 NOTE — Telephone Encounter (Signed)
Patient has questions regarding her medication for diabetes. Requests call back at [phone]

## 2022-01-27 MED ORDER — SHINGRIX 50 MCG/0.5ML IM SUSR: 0.5000 mL | Freq: Once | INTRAMUSCULAR | 1 refills | Status: AC

## 2022-01-31 ENCOUNTER — Other Ambulatory Visit: Payer: Self-pay | Admitting: Internal Medicine

## 2022-01-31 DIAGNOSIS — E118 Type 2 diabetes mellitus with unspecified complications: Secondary | ICD-10-CM

## 2022-02-02 NOTE — Telephone Encounter (Deleted)
Patient called back about a form that was filled out and sent to her insurance and she said the insurance is saying they dont have it and asked if we have a copy of it. Call back is [phone]. She asked if we could send again

## 2022-02-16 ENCOUNTER — Telehealth: Payer: Self-pay | Admitting: Internal Medicine

## 2022-02-16 ENCOUNTER — Other Ambulatory Visit: Payer: Self-pay | Admitting: Internal Medicine

## 2022-02-16 NOTE — Telephone Encounter (Signed)
Patient called stating that she is currently taking the medication Dulaglutide (TRULICITY) 0.72 UV/7.5YN SOPN and she currently has not been able to have a bowel movement. Patient understands that this could be a side effect to the medication. But she is wondering if something could be called to her pharmacy to help her.[facility] - [place], [place] - [address] Patient will like a call back at [phone].

## 2022-02-17 NOTE — Telephone Encounter (Signed)
Called Pt and left VM , also sent message via MyChart.

## 2022-02-21 ENCOUNTER — Other Ambulatory Visit: Payer: Self-pay | Admitting: Internal Medicine

## 2022-02-21 ENCOUNTER — Telehealth: Payer: Self-pay | Admitting: Internal Medicine

## 2022-02-21 DIAGNOSIS — E119 Type 2 diabetes mellitus without complications: Secondary | ICD-10-CM

## 2022-02-21 DIAGNOSIS — A6004 Herpesviral vulvovaginitis: Secondary | ICD-10-CM

## 2022-02-21 NOTE — Telephone Encounter (Signed)
Patient called needs her insulin pen needle refilled or a prescription for a new one as her broke. She also has questions about stopping the Trulicity, wants to know if she will be prescribed something else as a replacement. She would like a call back to discuss at [phone].

## 2022-02-23 ENCOUNTER — Other Ambulatory Visit: Payer: Self-pay | Admitting: Internal Medicine

## 2022-02-23 DIAGNOSIS — E119 Type 2 diabetes mellitus without complications: Secondary | ICD-10-CM

## 2022-02-23 MED ORDER — BASAGLAR KWIKPEN 100 UNIT/ML ~~LOC~~ SOPN: 30.0000 [IU] | PEN_INJECTOR | Freq: Every day | SUBCUTANEOUS | 0 refills | Status: DC

## 2022-02-23 MED ORDER — INSULIN PEN NEEDLE 32G X 6 MM MISC: 3.0000 | Freq: Every day | 1 refills | Status: DC

## 2022-02-28 ENCOUNTER — Other Ambulatory Visit: Payer: Self-pay | Admitting: Internal Medicine

## 2022-02-28 DIAGNOSIS — E785 Hyperlipidemia, unspecified: Secondary | ICD-10-CM

## 2022-03-01 ENCOUNTER — Telehealth: Payer: Self-pay | Admitting: Internal Medicine

## 2022-03-01 NOTE — Telephone Encounter (Signed)
Patient called requesting a referral for Nephropathy due to patient stating she is having really bad foot pain due to her diabetes. Best callback number is [phone].

## 2022-03-03 ENCOUNTER — Other Ambulatory Visit: Payer: Self-pay | Admitting: Internal Medicine

## 2022-03-03 DIAGNOSIS — E1140 Type 2 diabetes mellitus with diabetic neuropathy, unspecified: Secondary | ICD-10-CM

## 2022-03-05 ENCOUNTER — Other Ambulatory Visit: Payer: Self-pay | Admitting: Internal Medicine

## 2022-03-05 DIAGNOSIS — E118 Type 2 diabetes mellitus with unspecified complications: Secondary | ICD-10-CM

## 2022-03-11 ENCOUNTER — Other Ambulatory Visit: Payer: Self-pay | Admitting: Internal Medicine

## 2022-03-11 DIAGNOSIS — E119 Type 2 diabetes mellitus without complications: Secondary | ICD-10-CM

## 2022-03-30 ENCOUNTER — Other Ambulatory Visit: Payer: Self-pay | Admitting: Internal Medicine

## 2022-03-30 ENCOUNTER — Telehealth: Payer: Self-pay

## 2022-03-30 ENCOUNTER — Other Ambulatory Visit (HOSPITAL_COMMUNITY): Payer: Self-pay

## 2022-03-30 DIAGNOSIS — I10 Essential (primary) hypertension: Secondary | ICD-10-CM

## 2022-03-30 NOTE — Telephone Encounter (Signed)
Pharmacy Patient Advocate Encounter   Received notification from [place] that prior authorization for [facility] 100UNITI/ML is required/requested.  Per Test Claim: INSURANCE HAS PLAN FORMULARY THESE ARE SOME OF THE ONES THAT THEY WILL COVER WITH OUT PA: Lantus Solostar  Lantus vial  Levemir FlexTouch Pen  Levemir vial  Would you like to change to the preferred drug or would you like to proceed with the PA  [facility] Patient Advocate

## 2022-04-03 ENCOUNTER — Other Ambulatory Visit: Payer: Self-pay | Admitting: Internal Medicine

## 2022-04-03 ENCOUNTER — Telehealth: Payer: Self-pay | Admitting: Internal Medicine

## 2022-04-03 DIAGNOSIS — E119 Type 2 diabetes mellitus without complications: Secondary | ICD-10-CM

## 2022-04-03 MED ORDER — INSULIN GLARGINE 100 UNITS/ML SOLOSTAR PEN: 30.0000 [IU] | PEN_INJECTOR | Freq: Every day | SUBCUTANEOUS | 1 refills | Status: AC

## 2022-04-03 NOTE — Telephone Encounter (Signed)
Joy with [facility] calls today in regards to a switch of a prescription. PT was prescribed GNP ULTICARE PEN NEEDLES 32G X 6 MM MISC and they are wanting to know if Dr.Jones would approve for the 4 MM instead?  CB: [phone]

## 2022-04-03 NOTE — Telephone Encounter (Signed)
I have spoke with Joy and informed her that it would be ok to proceed with 56m pen needle.

## 2022-05-25 ENCOUNTER — Other Ambulatory Visit: Payer: Self-pay | Admitting: Internal Medicine

## 2022-05-25 ENCOUNTER — Telehealth: Payer: Self-pay | Admitting: Internal Medicine

## 2022-05-25 DIAGNOSIS — E119 Type 2 diabetes mellitus without complications: Secondary | ICD-10-CM

## 2022-05-25 NOTE — Telephone Encounter (Signed)
Prescription Request  05/25/2022  LOV: 01/25/2022  What is the name of the medication or equipment? GNP ULTICARE PEN NEEDLES 32G X 6 MM [facility] wants the 82mm needle instead of the 86mm. Pharmacy want a new script sent over with direction stating using pen 3 time a day  Have you contacted your pharmacy to request a refill? No   Which pharmacy would you like this sent to?  [place], [place] [address] [place] [address] Phone: [phone] Fax: [phone]  [id number] - [place], [place] [address] [place] [address] Phone: [phone] Fax: [phone]    Patient notified that their request is being sent to the clinical staff for review and that they should receive a response within 2 business days.   Please advise at Mobile [phone] (mobile)

## 2022-05-29 NOTE — Telephone Encounter (Signed)
Patient requested 4 MM for the needle instead of the 6 MM. Pharmacy would like to know if it is okay to change it. Best callback for [facility] is [phone]. Ask for Joy.

## 2022-05-29 NOTE — Telephone Encounter (Signed)
yes

## 2022-05-30 NOTE — Telephone Encounter (Signed)
Verbal given to [facility] to change Rx to 69mm needles.

## 2022-07-03 ENCOUNTER — Other Ambulatory Visit: Payer: Self-pay | Admitting: Internal Medicine

## 2022-07-03 DIAGNOSIS — E785 Hyperlipidemia, unspecified: Secondary | ICD-10-CM

## 2022-07-12 ENCOUNTER — Other Ambulatory Visit: Payer: Self-pay | Admitting: Internal Medicine

## 2022-07-12 DIAGNOSIS — E119 Type 2 diabetes mellitus without complications: Secondary | ICD-10-CM

## 2022-09-06 ENCOUNTER — Other Ambulatory Visit: Payer: Self-pay | Admitting: Internal Medicine

## 2022-09-06 DIAGNOSIS — E119 Type 2 diabetes mellitus without complications: Secondary | ICD-10-CM

## 2022-09-15 ENCOUNTER — Other Ambulatory Visit: Payer: Self-pay | Admitting: Internal Medicine

## 2022-09-15 DIAGNOSIS — I10 Essential (primary) hypertension: Secondary | ICD-10-CM

## 2022-09-21 ENCOUNTER — Other Ambulatory Visit: Payer: Self-pay | Admitting: Internal Medicine

## 2022-09-21 DIAGNOSIS — Z794 Long term (current) use of insulin: Secondary | ICD-10-CM

## 2022-09-22 IMAGING — CT CT ABD-PELV W/ CM
1 of 3 series · 12 of 32 positions shown, 17 images · IV contrast (APPLIED)
Comparison: January 27, 2019
COMPARISON: January 27, 2019

Addendum:
CLINICAL DATA: Acute nonlocalized abdominal pain. Chronic
right-sided low pain. Lower abdominal for 3 years.

EXAM:
CT ABDOMEN AND PELVIS WITH CONTRAST
TECHNIQUE: Multidetector CT imaging of the abdomen and pelvis was performed
using the standard protocol following bolus administration of
intravenous contrast.
CONTRAST:  100mL 88IIAL-KDD IOPAMIDOL (88IIAL-KDD) INJECTION 61%

[Series 2: abd/pelvis w/cm · axial · 0.79mm/px · z∈[-341,+69]mm · 12 of 96 slices shown, 17 images]
[im 7/96  soft-tissue]
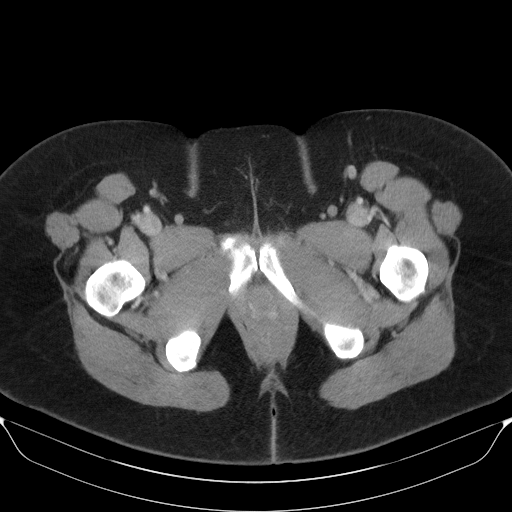
[im 7/96  bone]
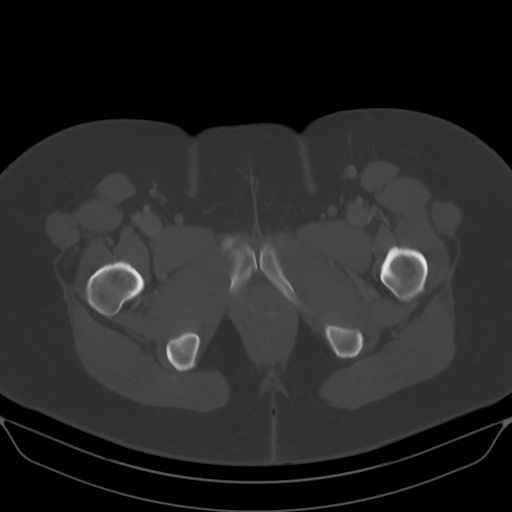
[im 14/96  soft-tissue]
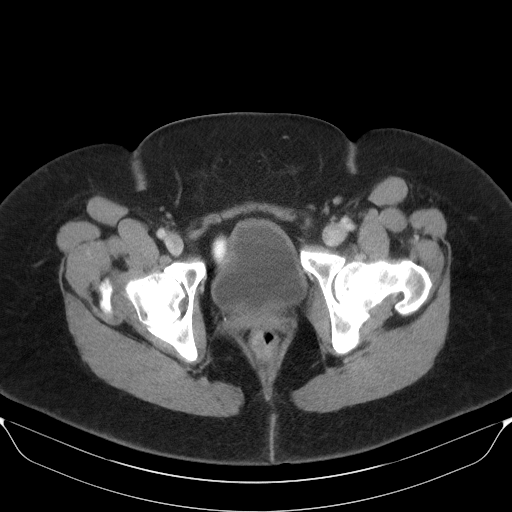
[im 21/96  soft-tissue]
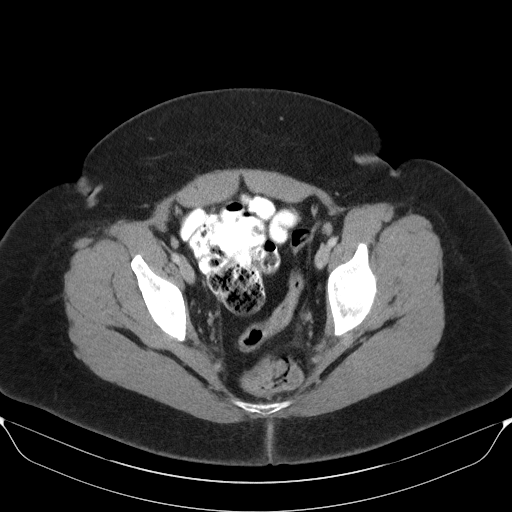
[im 34/96  soft-tissue]
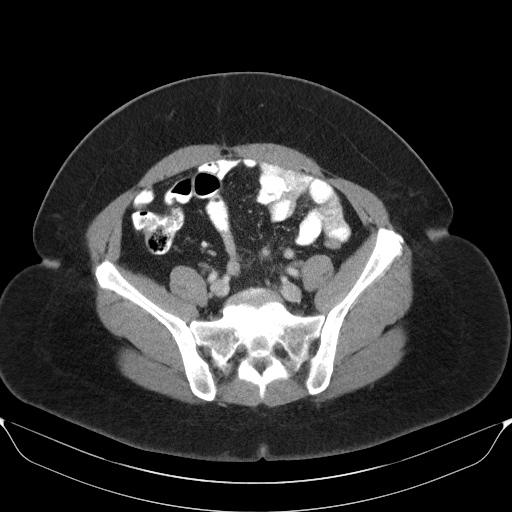
[im 41/96  soft-tissue]
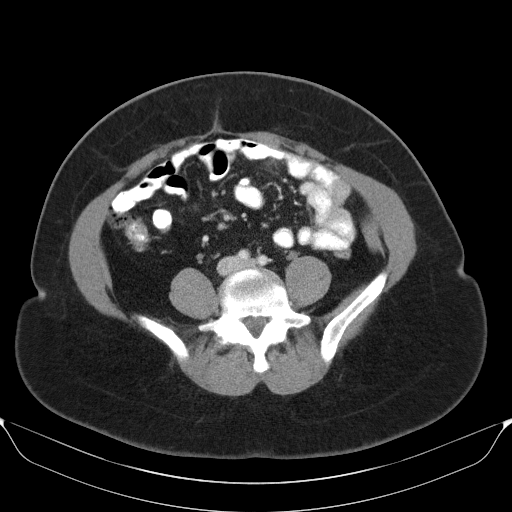
[im 48/96  soft-tissue]
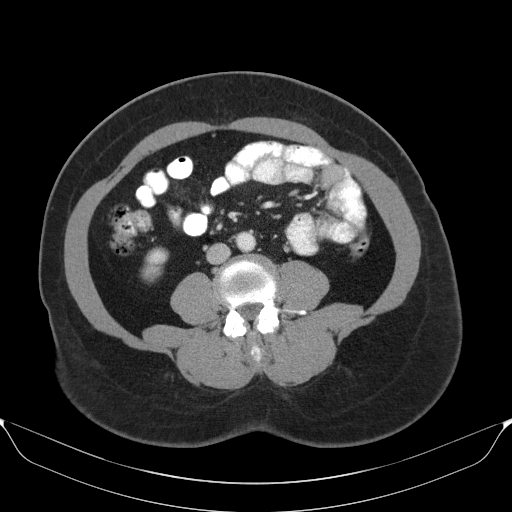
[im 55/96  soft-tissue]
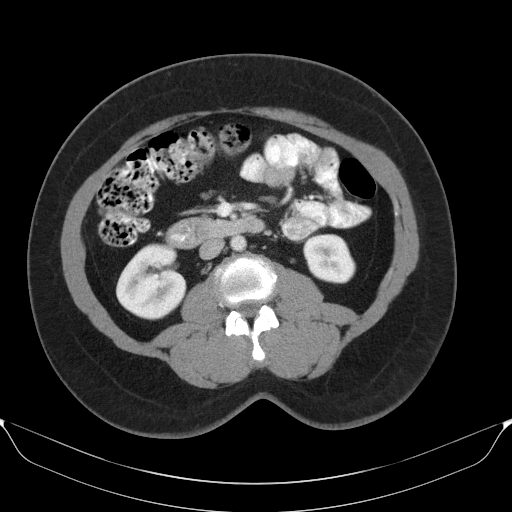
[im 62/96  soft-tissue]
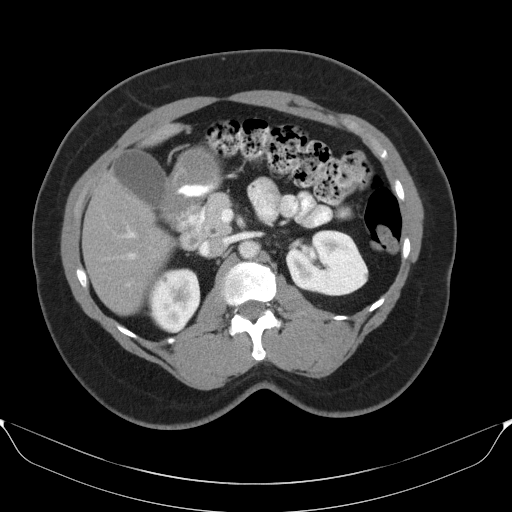
[im 68/96  lung]
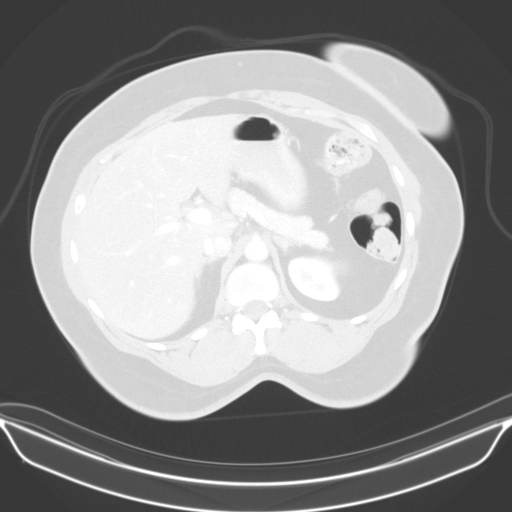
[im 75/96  soft-tissue]
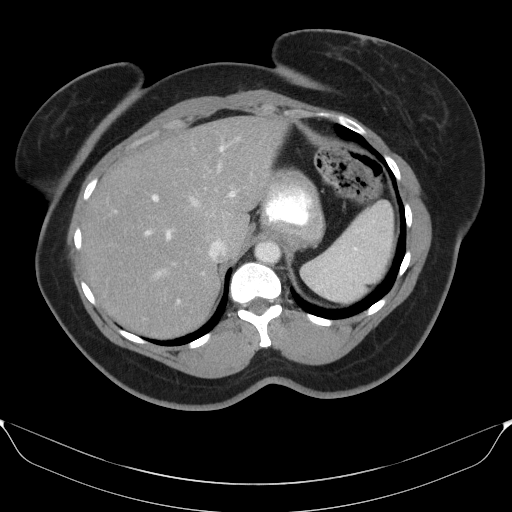
[im 75/96  lung]
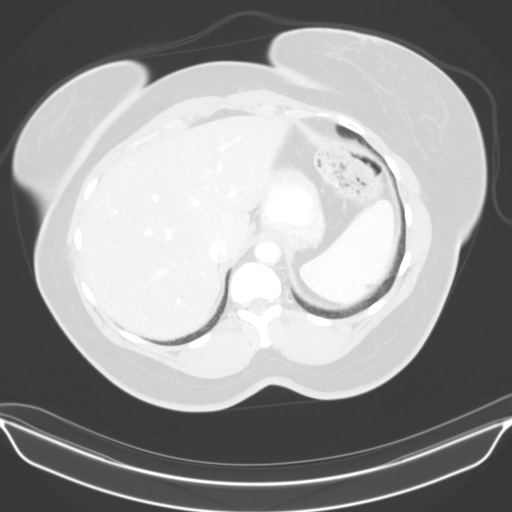
[im 75/96  bone]
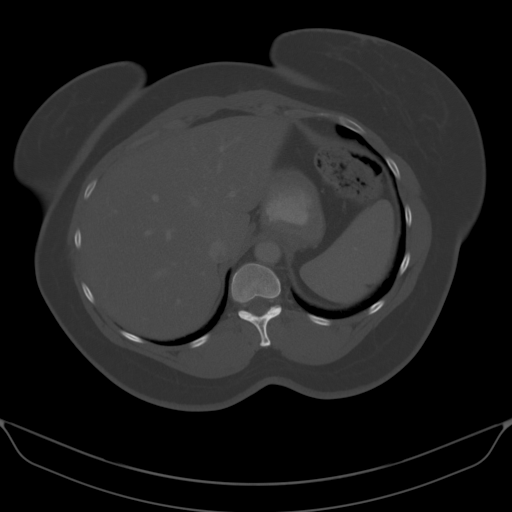
[im 82/96  soft-tissue]
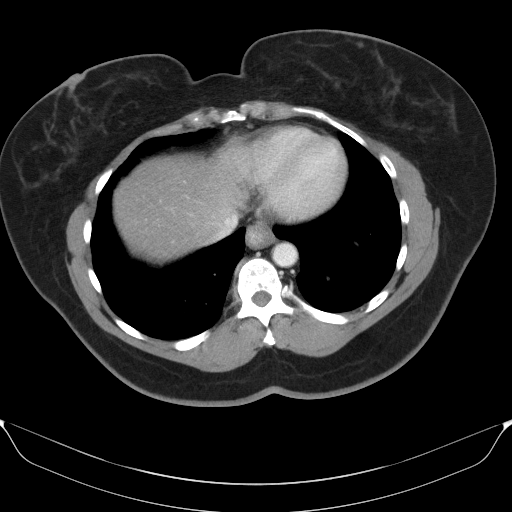
[im 82/96  lung]
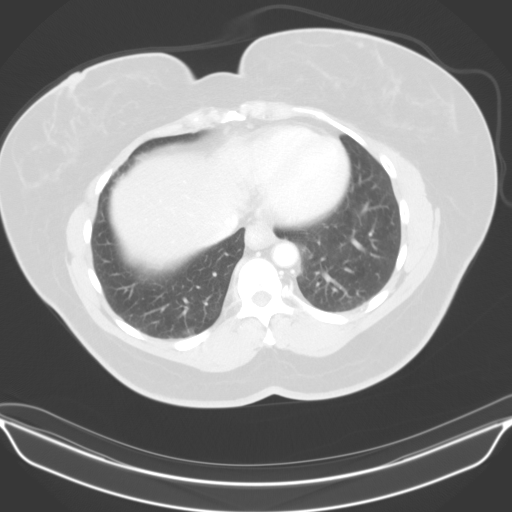
[im 89/96  soft-tissue]
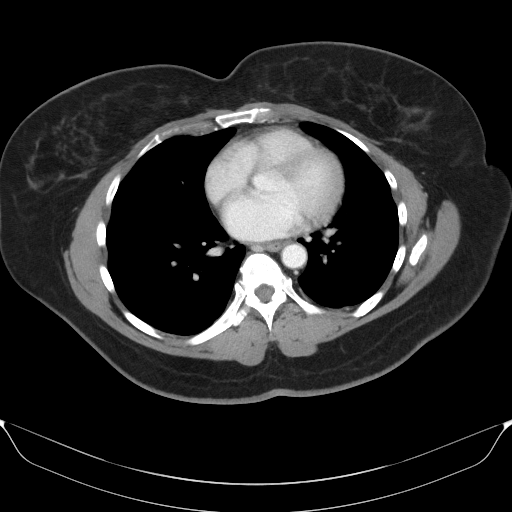
[im 89/96  lung]
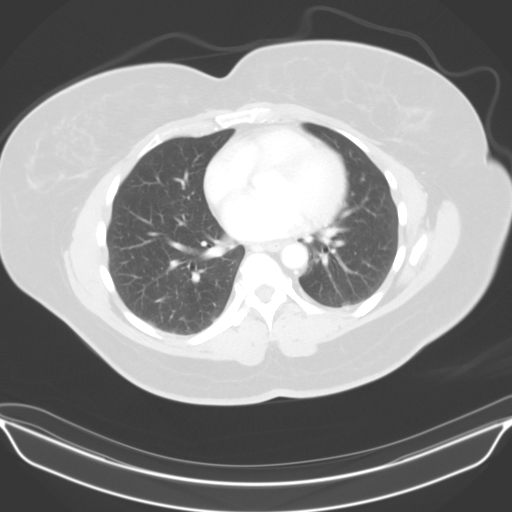

[12 of 32 positions shown; findings below may reference images not displayed]

FINDINGS: Lower chest: There is a ground-glass in the right lung base on
series 5, image 22 measuring 6 mm no other suspicious nodules. No
masses or infiltrates no other abnormalities in the lower chest.

Hepatobiliary: Hepatic steatosis is identified. Gallbladder is
normal. Portal vein is patent.

Pancreas: Unremarkable. No pancreatic ductal dilatation or
surrounding inflammatory changes.

Spleen: Normal in size without focal abnormality.

Adrenals/Urinary Tract: The known adrenal adenoma is stable. Adrenal
glands are all otherwise normal few tiny low-attenuation lesions the
kidneys too small to characterize likely cysts. No suspicious
masses, hydronephrosis, or perinephric stranding. No ureterectasis
or ureteral stones. The bladder is normal.

Stomach/Bowel: The stomach is unremarkable. The wall the second
portion of the duodenum is mildly adjacent fat stranding. This is
likely due to lack distention. The distal esophagus stomach small
bowel, colon and visualized appendix are otherwise normal

Vascular/Lymphatic: No significant vascular findings are present. No
enlarged abdominal or pelvic lymph nodes.

Reproductive: Status post hysterectomy. No adnexal masses.

Other: No abdominal wall hernia or abnormality. No abdominopelvic
ascites.

Musculoskeletal: No acute or significant osseous findings.
IMPRESSION: 1. No acute abnormalities are identified. No cause for the patient's
abdominal pain noted.
2. There is a 6 mm ground-glass nodule in the right lung as above.
3. Hepatic steatosis.
4. Known left adrenal adenoma.
5. No other acute abnormalities.

ADDENDUM:
There is a 6 mm ground-glass nodule in the right lung as described
in the findings section of the report. Initial follow-up with CT at
6-12 months is recommended to confirm persistence. If persistent,
repeat CT is recommended every 2 years until 5 years of stability
has been established. This recommendation follows the consensus
statement: Guidelines for Management of Incidental Pulmonary Nodules
Detected on CT Images: From the [HOSPITAL] 6366; Radiology
6366; [DATE].

These results will be called to the ordering clinician or
representative by the Radiologist Assistant, and communication
documented in the PACS or [REDACTED].

*** End of Addendum ***
FINDINGS: Lower chest: There is a ground-glass in the right lung base on
series 5, image 22 measuring 6 mm no other suspicious nodules. No
masses or infiltrates no other abnormalities in the lower chest.

Hepatobiliary: Hepatic steatosis is identified. Gallbladder is
normal. Portal vein is patent.

Pancreas: Unremarkable. No pancreatic ductal dilatation or
surrounding inflammatory changes.

Spleen: Normal in size without focal abnormality.

Adrenals/Urinary Tract: The known adrenal adenoma is stable. Adrenal
glands are all otherwise normal few tiny low-attenuation lesions the
kidneys too small to characterize likely cysts. No suspicious
masses, hydronephrosis, or perinephric stranding. No ureterectasis
or ureteral stones. The bladder is normal.

Stomach/Bowel: The stomach is unremarkable. The wall the second
portion of the duodenum is mildly adjacent fat stranding. This is
likely due to lack distention. The distal esophagus stomach small
bowel, colon and visualized appendix are otherwise normal

Vascular/Lymphatic: No significant vascular findings are present. No
enlarged abdominal or pelvic lymph nodes.

Reproductive: Status post hysterectomy. No adnexal masses.

Other: No abdominal wall hernia or abnormality. No abdominopelvic
ascites.

Musculoskeletal: No acute or significant osseous findings.
IMPRESSION: 1. No acute abnormalities are identified. No cause for the patient's
abdominal pain noted.
2. There is a 6 mm ground-glass nodule in the right lung as above.
3. Hepatic steatosis.
4. Known left adrenal adenoma.
5. No other acute abnormalities.

## 2022-11-03 ENCOUNTER — Other Ambulatory Visit: Payer: Self-pay | Admitting: Internal Medicine

## 2022-11-03 DIAGNOSIS — E119 Type 2 diabetes mellitus without complications: Secondary | ICD-10-CM

## 2022-11-10 ENCOUNTER — Other Ambulatory Visit: Payer: Self-pay | Admitting: Internal Medicine

## 2022-11-10 DIAGNOSIS — E119 Type 2 diabetes mellitus without complications: Secondary | ICD-10-CM

## 2022-11-30 ENCOUNTER — Other Ambulatory Visit: Payer: Self-pay | Admitting: Internal Medicine

## 2022-11-30 DIAGNOSIS — A6004 Herpesviral vulvovaginitis: Secondary | ICD-10-CM

## 2023-01-04 ENCOUNTER — Encounter: Payer: Self-pay | Admitting: Internal Medicine
